# Patient Record
Sex: Female | Born: 1955 | Race: Black or African American | Hispanic: No | Marital: Single | Smoking: Never smoker
Health system: Southern US, Community
[De-identification: ages and names within clinical notes are randomized; demographics above are authoritative.]

## PROBLEM LIST (undated history)

## (undated) DIAGNOSIS — I10 Essential (primary) hypertension: Secondary | ICD-10-CM

## (undated) DIAGNOSIS — F200 Paranoid schizophrenia: Secondary | ICD-10-CM

## (undated) HISTORY — PX: CHOLECYSTECTOMY: SHX55

## (undated) HISTORY — DX: Paranoid schizophrenia: F20.0

## (undated) HISTORY — DX: Essential (primary) hypertension: I10

---

## 2017-01-08 ENCOUNTER — Encounter (HOSPITAL_COMMUNITY): Payer: Self-pay | Admitting: Licensed Clinical Social Worker

## 2017-01-08 ENCOUNTER — Emergency Department (HOSPITAL_COMMUNITY)
Admission: EM | Admit: 2017-01-08 | Discharge: 2017-01-10 | Disposition: A | Discharge status: Other Acute Inpatient Hospital | Payer: Medicare Other | Attending: Emergency Medicine | Admitting: Emergency Medicine

## 2017-01-08 DIAGNOSIS — Z046 Encounter for general psychiatric examination, requested by authority: Secondary | ICD-10-CM | POA: Diagnosis not present

## 2017-01-08 DIAGNOSIS — I10 Essential (primary) hypertension: Secondary | ICD-10-CM | POA: Diagnosis present

## 2017-01-08 DIAGNOSIS — F200 Paranoid schizophrenia: Secondary | ICD-10-CM | POA: Diagnosis present

## 2017-01-08 DIAGNOSIS — Z0000 Encounter for general adult medical examination without abnormal findings: Secondary | ICD-10-CM

## 2017-01-08 DIAGNOSIS — F99 Mental disorder, not otherwise specified: Secondary | ICD-10-CM | POA: Diagnosis present

## 2017-01-08 LAB — COMPREHENSIVE METABOLIC PANEL
ALT: 37 U/L (ref 14–54)
AST: 35 U/L (ref 15–41)
Albumin: 4 g/dL (ref 3.5–5.0)
Alkaline Phosphatase: 56 U/L (ref 38–126)
Anion gap: 12 (ref 5–15)
BUN: 18 mg/dL (ref 6–20)
CHLORIDE: 104 mmol/L (ref 101–111)
CO2: 25 mmol/L (ref 22–32)
CREATININE: 1.1 mg/dL — AB (ref 0.44–1.00)
Calcium: 9.4 mg/dL (ref 8.9–10.3)
GFR, EST NON AFRICAN AMERICAN: 53 mL/min — AB (ref >60)
Glucose, Bld: 114 mg/dL — ABNORMAL HIGH (ref 65–99)
Potassium: 3.9 mmol/L (ref 3.5–5.1)
Sodium: 141 mmol/L (ref 135–145)
Total Bilirubin: 0.4 mg/dL (ref 0.3–1.2)
Total Protein: 7.9 g/dL (ref 6.5–8.1)

## 2017-01-08 LAB — ACETAMINOPHEN LEVEL: Acetaminophen (Tylenol), Serum: <10 ug/mL — ABNORMAL LOW (ref 10–30)

## 2017-01-08 LAB — CBC
HCT: 41.5 % (ref 36.0–46.0)
Hemoglobin: 13.6 g/dL (ref 12.0–15.0)
MCH: 27.8 pg (ref 26.0–34.0)
MCHC: 32.8 g/dL (ref 30.0–36.0)
MCV: 84.9 fL (ref 78.0–100.0)
PLATELETS: 189 10*3/uL (ref 150–400)
RBC: 4.89 MIL/uL (ref 3.87–5.11)
RDW: 13.9 % (ref 11.5–15.5)
WBC: 8.2 10*3/uL (ref 4.0–10.5)

## 2017-01-08 LAB — ETHANOL

## 2017-01-08 LAB — SALICYLATE LEVEL

## 2017-01-08 MED ORDER — ACETAMINOPHEN 325 MG PO TABS: 650.0000 mg | ORAL_TABLET | ORAL | Status: DC | PRN

## 2017-01-08 MED ORDER — HALOPERIDOL LACTATE 5 MG/ML IJ SOLN
10.0000 mg | Freq: Four times a day (QID) | INTRAMUSCULAR | Status: DC | PRN
  Administered 2017-01-08: 10 mg via INTRAMUSCULAR
  Filled 2017-01-08 (×2): qty 2

## 2017-01-08 MED ORDER — METOPROLOL TARTRATE 25 MG PO TABS
25.0000 mg | ORAL_TABLET | Freq: Two times a day (BID) | ORAL | Status: DC
  Administered 2017-01-08 – 2017-01-09 (×2): 25 mg via ORAL
  Filled 2017-01-08 (×2): qty 1

## 2017-01-08 MED ORDER — METOPROLOL TARTRATE 25 MG/10 ML ORAL SUSPENSION
25.0000 mg | Freq: Two times a day (BID) | ORAL | Status: DC
  Filled 2017-01-08 (×3): qty 10

## 2017-01-08 MED ORDER — METOPROLOL TARTRATE 25 MG PO TABS
25.0000 mg | ORAL_TABLET | Freq: Two times a day (BID) | ORAL | Status: DC
  Administered 2017-01-08: 25 mg via ORAL
  Filled 2017-01-08: qty 1

## 2017-01-08 MED ORDER — ZIPRASIDONE MESYLATE 20 MG IM SOLR: 10.0000 mg | Freq: Three times a day (TID) | INTRAMUSCULAR | Status: DC | PRN

## 2017-01-08 NOTE — ED Notes (Signed)
Family member wants to take her mothers personal items with her. Pt agrees to this request one bag given to daughter with purse

## 2017-01-08 NOTE — Progress Notes (Signed)
Pt accepted to [facility] [address] [facility] [place] >10 AM on 01/09/17.  Dr. Merideth AbbeyEnrique Lopez accepting. Call report to [phone]   [facility] notified  Timmothy EulerJean T. Kaylyn LimSutter, MSW, LCSWA Clinical Social Work Disposition [phone]

## 2017-01-08 NOTE — ED Notes (Signed)
She is agitated and very resistive of care. Dr. Jeraldine LootsLockwood has signed IVC papers and security and police are here assisting with her care.

## 2017-01-08 NOTE — BH Assessment (Addendum)
[facility]  Per Elta GuadeloupeLaurie Parks, NP, this pt requires psychiatric hospitalization at this time. Pt presents under IVC initiated by EDP Gerhard Munchobert Lockwood, MD.  The following facilities have been contacted to seek placement for this pt, with results as noted:  Beds available, information sent, decision pending:  [facility]   At capacity:  [facility]   Mattelyn Imhoff, [place] Triage Specialist [phone]

## 2017-01-08 NOTE — ED Notes (Signed)
Bed: [facility] Expected date:  Expected time:  Means of arrival:  Comments: 

## 2017-01-08 NOTE — ED Provider Notes (Signed)
[facility]Y DEPT Provider Note   CSN: [id number] Arrival date & time: 01/08/17  1128     History   Chief Complaint Chief Complaint  Patient presents with  . Mental Health Problem    HPI Janet Francis is a 61 y.o. female.  HPI  Patient presents with daughter who provides most of the history of present illness as the patient is delusional, paranoid, verbally aggressive. Level V caveat secondary to psychiatric state. The patient was brought here by her daughter, under the pre-text of having her grandson evaluated.  Patient denies medical problems, psychiatric problems, but it is clear through discussion with her and her daughter that the patient has previously been hospitalized committed in multiple states, multiple times, takes no medication, though she has been prescribed psychotropic meds. Reportedly the patient is paranoid, delusional,Agitated, will not water due to concerns of it being contaminated with bleach, intentionally, but others, perseverates on paranoid statements, is prone to outbursts, erratic behavior.  Initially the patient's daughter is planning to proceed to executed involuntary commitment papers on the patient, but given the patient's agitated status here, I did so.   No past medical history on file.  There are no active problems to display for this patient.   No past surgical history on file.  OB History    No data available       Home Medications    Prior to Admission medications   Not on File    Family History No family history on file.  Social History Social History  Substance Use Topics  . Smoking status: Not on file  . Smokeless tobacco: Not on file  . Alcohol use Not on file     Allergies   Patient has no allergy information on record.   Review of Systems Review of Systems  Unable to perform ROS: Psychiatric disorder     Physical Exam Updated Vital Signs There were no vitals taken for this visit.  Physical Exam    Constitutional: She is oriented to person, place, and time. She appears well-developed and well-nourished. She appears distressed.  HENT:  Head: Normocephalic and atraumatic.  Eyes: Conjunctivae and EOM are normal.  Cardiovascular: Normal rate and regular rhythm.   Pulmonary/Chest: Effort normal and breath sounds normal. No stridor. No respiratory distress.  Abdominal: She exhibits no distension.  Musculoskeletal: She exhibits no edema.  Neurological: She is alert and oriented to person, place, and time. No cranial nerve deficit.  Skin: Skin is warm and dry.  Psychiatric: Her mood appears anxious. Her affect is angry and blunt. Her speech is rapid and/or pressured. She is aggressive and combative. Thought content is paranoid and delusional. Cognition and memory are impaired.  Nursing note and vitals reviewed.    ED Treatments / Results  Labs (all labs ordered are listed, but only abnormal results are displayed) Labs Reviewed - No data to display    Procedures Procedures (including critical care time)  Medications Ordered in ED Medications  haloperidol lactate (HALDOL) injection 10 mg (10 mg Intramuscular Given 01/08/17 1241)  acetaminophen (TYLENOL) tablet 650 mg (not administered)  ziprasidone (GEODON) injection 10 mg (not administered)   After the initial evaluation with concerns for the patient's mental health status, ongoing paranoid fixation, delusional statements, and no insight into her condition, couple of patient's aggressive, agitated behavior I executed involuntary commitment papers.   Patient is found to have some hypertension, received liquid beta blocker.  Initial Impression / Assessment and Plan / ED Course  I have  reviewed the triage vital signs and the nursing notes.  Pertinent labs & imaging results that were available during my care of the patient were reviewed by me and considered in my medical decision making (see chart for details).  Elderly female  presents with family members due to concern of persistent/worsening paranoid delusional thought processes, agitated behavior. Patient has previously diagnosed gastric disease, but does not take any medication currently. Patient herself denies any problems, but is clearly delusional. Patient required sedation to facilitate care, and after involuntary commitment papers were completed, she was medically clear for psychiatric evaluation.  Final Clinical Impressions(s) / ED Diagnoses  Psychosis   Gerhard MunchLockwood, Sugar Vanzandt, MD 01/08/17 [phone]
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
## 2017-01-08 NOTE — ED Triage Notes (Signed)
Her daughter states pt. Is paranoid, I.e. "she won't drink water because it has bleach in it". Also, she will only eat "fast food". Her daughter further tells me pt. Has been under [place]C [place] both [place] and also [facility]. She arrives with her daughter and two grandchildren. Pt. Came in to our department by dint of a feint telling her the visit was for one of the grandchildren.

## 2017-01-08 NOTE — ED Notes (Signed)
Bed: [id number] Expected date:  Expected time:  Means of arrival:  Comments: Patient coming back to room after endoscopy

## 2017-01-08 NOTE — ED Notes (Signed)
Patient transferred to TCU. In red scrubs, sitter accompanied patient. Provided patient with meal tray. Informed MD of elevated BP. Order for liquid metoprolol placed by MD. Patient currently refusing to eat or drink. Also refusing to answer questions. Asks that she be released in 24 hours because 'that's all they can hold me for'.

## 2017-01-08 NOTE — BH Assessment (Signed)
Patient's oldest daughter is Elwyn LadeShanetta Moore [phone]. [place]. She is concerned about her mother. Sts that she feels her mother is not able to care for herself. She welcomes [facility] staff to call for collateral information if needed.

## 2017-01-08 NOTE — ED Notes (Signed)
Re checked bp spoke with provider

## 2017-01-09 ENCOUNTER — Emergency Department (HOSPITAL_COMMUNITY): Payer: Medicare Other

## 2017-01-09 DIAGNOSIS — I10 Essential (primary) hypertension: Secondary | ICD-10-CM | POA: Diagnosis present

## 2017-01-09 DIAGNOSIS — Z0000 Encounter for general adult medical examination without abnormal findings: Secondary | ICD-10-CM

## 2017-01-09 DIAGNOSIS — F200 Paranoid schizophrenia: Secondary | ICD-10-CM | POA: Diagnosis not present

## 2017-01-09 LAB — URINALYSIS, ROUTINE W REFLEX MICROSCOPIC
Bilirubin Urine: NEGATIVE
Glucose, UA: NEGATIVE mg/dL
Hgb urine dipstick: NEGATIVE
KETONES UR: NEGATIVE mg/dL
Nitrite: NEGATIVE
Protein, ur: NEGATIVE mg/dL
SPECIFIC GRAVITY, URINE: 1.006 (ref 1.005–1.030)
pH: 6 (ref 5.0–8.0)

## 2017-01-09 LAB — RAPID URINE DRUG SCREEN, HOSP PERFORMED
AMPHETAMINES: NOT DETECTED
Barbiturates: NOT DETECTED
Benzodiazepines: NOT DETECTED
COCAINE: NOT DETECTED
OPIATES: NOT DETECTED
Tetrahydrocannabinol: NOT DETECTED

## 2017-01-09 MED ORDER — METOPROLOL TARTRATE 25 MG PO TABS
50.0000 mg | ORAL_TABLET | Freq: Two times a day (BID) | ORAL | Status: DC
  Administered 2017-01-09: 50 mg via ORAL
  Filled 2017-01-09: qty 2

## 2017-01-09 MED ORDER — AMLODIPINE BESYLATE 5 MG PO TABS
10.0000 mg | ORAL_TABLET | Freq: Every day | ORAL | Status: DC
  Administered 2017-01-09 – 2017-01-10 (×2): 10 mg via ORAL
  Filled 2017-01-09 (×2): qty 2

## 2017-01-09 MED ORDER — CARBAMAZEPINE ER 200 MG PO TB12
200.0000 mg | ORAL_TABLET | Freq: Two times a day (BID) | ORAL | Status: DC
  Administered 2017-01-09 – 2017-01-10 (×2): 200 mg via ORAL
  Filled 2017-01-09 (×3): qty 1

## 2017-01-09 MED ORDER — LISINOPRIL 10 MG PO TABS
10.0000 mg | ORAL_TABLET | Freq: Every day | ORAL | Status: DC
  Administered 2017-01-09 – 2017-01-10 (×2): 10 mg via ORAL
  Filled 2017-01-09 (×2): qty 1

## 2017-01-09 MED ORDER — RISPERIDONE 1 MG PO TBDP
1.0000 mg | ORAL_TABLET | Freq: Two times a day (BID) | ORAL | Status: DC
  Administered 2017-01-09 – 2017-01-10 (×3): 1 mg via ORAL
  Filled 2017-01-09 (×3): qty 1

## 2017-01-09 NOTE — ED Notes (Signed)
Patient is not cooperating with obtaining a urine sample.  She keeps taking the urine "hat" off the toilet before urinating.  Earlier this morning she gave the tech a specimen that was water.
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
## 2017-01-09 NOTE — ED Notes (Signed)
[facility], but they said her blood pressure would have to be under control before she could be admitted there.  Dr. Clarene DukeMcManus advised to let the psyche team know when rounding this morning.

## 2017-01-09 NOTE — ED Notes (Signed)
bp reported to provider  bp med given and reported to provider  Provider to investigate future meds and resolve

## 2017-01-09 NOTE — ED Notes (Signed)
Waiting urine  

## 2017-01-09 NOTE — ED Notes (Signed)
Pt up to door.  Pt denied any needs.

## 2017-01-09 NOTE — ED Notes (Signed)
Patient signed a release of information form to share info with Regan RakersLakiesha Foreman and Laureen AbrahamsShanita Moore.

## 2017-01-09 NOTE — ED Notes (Signed)
Patient agreed to take Risperidone after much urging by nurse, but refused to take Tegretol.

## 2017-01-09 NOTE — ED Notes (Signed)
Patient transported to X-ray 

## 2017-01-09 NOTE — ED Provider Notes (Signed)
Psych team continues concerned regarding continued elevated BP despite pt taking one dose of lisinopril and Risperdal a short time ago. I do not believe pt needs medical admission for her BP, but rather optimization of, and to take, PO meds. T/C to [facility] Dr. Arthor CaptainElmahi, case discussed, including:  HPI, pertinent PM/SHx, VS/PE, dx testing, ED course and treatment:  Agreeable to consult regarding BP management.    Samuel JesterMcManus, Oneida Mckamey, DO 01/09/17 1315

## 2017-01-09 NOTE — ED Notes (Signed)
[facility]  They will hold patient's bed as long as she is compliant with BP medications until it begins to come down.

## 2017-01-09 NOTE — ED Notes (Signed)
Nurse thought patient had taken her medication, but after putting it in her mouth she quickly removed it and put it in her pocket.  When asked why she wouldn't take her medication, patient said she takes certain medications in some states but not in others.  Dr. Clarene DukeMcManus notified.

## 2017-01-09 NOTE — ED Notes (Signed)
Explained risks of not taking blood pressure medication to patient.  Patient stated that Janet PieriniMichael Jackson died taking medication so that was why she did not want to take any.  When nurse explained that it was a completely different medication than her blood pressure medications, she replied she just doesn't want to take them.
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
## 2017-01-09 NOTE — ED Provider Notes (Signed)
T/C from Psych Dr. Jannifer FranklinAkintayo:  Requested EDP to evaluate pt for 2nd opinion regarding pt's paranoia, refusal to take meds, and need to force meds. Upon my evaluation: pt peering out of exam room, looking up and down hallways, then returning to her bed to sit and make notes on a piece of paper. Pt then folded the paper and moved it to the other side of her so I would not see it. For her safety, pt will need forced meds if she further continues to refuse.    Samuel JesterMcManus, Drucella Karbowski, DO 01/09/17 1228

## 2017-01-09 NOTE — Consult Note (Signed)
Medical Consultation   Janet Francis  ZOX:[id number]  DOB: 10-15-1955  DOA: 01/08/2017  PCP: Patient, No Pcp Per   Outpatient Specialists: N/A   Requesting physician: Clarene DukeMcManus  Reason for consultation: Accelerated hypertension   History of Present Illness: Janet Francis is an 61 y.o. female with basilar history of blood pressure and paranoid schizophrenia brought to the hospital by her daughter because of worsening paranoia. Patient being in the ED holding area to wait for psychiatric admission, blood pressure in the high side, his BP around 200, she refused medications yesterday so [facility] consulted for further evaluation. I spoke with the patient for very long time she is extremely paranoid, we discussed about starting amlodipine, and she said she will not take it before she understand what it is, I have to give her my phone, she googled amlodipine before she agreed to take it. She denies any chest pain, denies any shortness of breath or palpitations.  Review of Systems:  ROS As per HPI otherwise 10 point review of systems negative.    Past Medical History: No past medical history on file.  Past Surgical History: No past surgical history on file.   Allergies:  Not on File   Social History:  has no tobacco, alcohol, and drug history on file.   Family History: No family history of premature heart disease  Physical Exam: Vitals:   01/09/17 0627 01/09/17 0629 01/09/17 0752 01/09/17 1246  BP: (!) 202/104 (!) 204/85 (!) 219/83 (!) 213/98  Pulse: (!) 101  (!) 101 99  Resp: 18     Temp: 98.5 F (36.9 C)   98.4 F (36.9 C)  TempSrc: Oral   Oral  SpO2: 100%  100% 98%    Constitutional:   Alert and awake, oriented x3, not in any acute distress. Eyes: PERLA, EOMI, irises appear normal, anicteric sclera,  ENMT: external ears and nose appear normal            Lips appears normal, oropharynx mucosa, tongue, posterior pharynx appear normal  Neck: neck  appears normal, no masses, normal ROM, no thyromegaly, no JVD  CVS: S1-S2 clear, no murmur rubs or gallops, no LE edema, normal pedal pulses  Respiratory:  clear to auscultation bilaterally, no wheezing, rales or rhonchi. Respiratory effort normal. No accessory muscle use.  Abdomen: soft nontender, nondistended, normal bowel sounds, no hepatosplenomegaly, no hernias  Musculoskeletal: : no cyanosis, clubbing or edema noted bilaterally Neuro: Cranial nerves II-XII intact, strength, sensation, reflexes Psych: judgement and insight appear normal, stable mood and affect, mental status Skin: no rashes or lesions or ulcers, no induration or nodules   Data reviewed:  I have personally reviewed following labs and imaging studies Labs:  CBC:  Recent Labs Lab 01/08/17 1418  WBC 8.2  HGB 13.6  HCT 41.5  MCV 84.9  PLT 189    Basic Metabolic Panel:  Recent Labs Lab 01/08/17 1418  NA 141  K 3.9  CL 104  CO2 25  GLUCOSE 114*  BUN 18  CREATININE 1.10*  CALCIUM 9.4   GFR CrCl cannot be calculated (Unknown ideal weight.). Liver Function Tests:  Recent Labs Lab 01/08/17 1418  AST 35  ALT 37  ALKPHOS 56  BILITOT 0.4  PROT 7.9  ALBUMIN 4.0   No results for input(s): LIPASE, AMYLASE in the last 168 hours. No results for input(s): AMMONIA in the last 168 hours. Coagulation profile No results for  input(s): INR, PROTIME in the last 168 hours.  Cardiac Enzymes: No results for input(s): CKTOTAL, CKMB, CKMBINDEX, TROPONINI in the last 168 hours. BNP: Invalid input(s): POCBNP CBG: No results for input(s): GLUCAP in the last 168 hours. D-Dimer No results for input(s): DDIMER in the last 72 hours. Hgb A1c No results for input(s): HGBA1C in the last 72 hours. Lipid Profile No results for input(s): CHOL, HDL, LDLCALC, TRIG, CHOLHDL, LDLDIRECT in the last 72 hours. Thyroid function studies No results for input(s): TSH, T4TOTAL, T3FREE, THYROIDAB in the last 72 hours.  Invalid  input(s): FREET3 Anemia work up No results for input(s): VITAMINB12, FOLATE, FERRITIN, TIBC, IRON, RETICCTPCT in the last 72 hours. Urinalysis No results found for: COLORURINE, APPEARANCEUR, LABSPEC, PHURINE, GLUCOSEU, HGBUR, BILIRUBINUR, KETONESUR, PROTEINUR, UROBILINOGEN, NITRITE, LEUKOCYTESUR   Microbiology No results found for this or any previous visit (from the past 240 hour(s)).     Inpatient Medications:   Scheduled Meds: . amLODipine  10 mg Oral Daily  . carbamazepine  200 mg Oral BID  . lisinopril  10 mg Oral Daily  . risperiDONE  1 mg Oral BID   Continuous Infusions:   Radiological Exams on Admission: Dg Chest 2 View  Result Date: 01/09/2017 CLINICAL DATA:  Medical clearance EXAM: CHEST  2 VIEW COMPARISON:  None FINDINGS: Normal heart size, mediastinal contours, and pulmonary vascularity. Lungs clear. No pleural effusion or pneumothorax. Bones unremarkable. IMPRESSION: Normal exam. Electronically Signed   By: Ulyses Southward M.D.   On: 01/09/2017 12:22    Impression/Recommendations Principal Problem:   Paranoid schizophrenia (HCC)  Accelerated hypertension -Blood pressure is 130/100, patient restarted on lisinopril by EDP. Also started on metoprolol. -Patient has paranoia, did not take metoprolol and only took lisinopril. She does not have IV access for IV meds. -She is agreeable to take amlodipine, going to be on 10 mg daily. -She reported that she is off of her medication for the past 6 months, tried to bring down the BP slowly  Paranoid schizophrenia -Per primary service and psychiatry   Thank you for this consultation.  Our [facility] hospitalist team will follow the patient with you.   Time Spent: 45 minutes  Raybon Conard A M.D. [facility] 01/09/2017, 3:06 PM

## 2017-01-09 NOTE — ED Notes (Signed)
Patient took lisinopril reluctantly.  She was worried because it was not the same color as the one she is used to.

## 2017-01-10 NOTE — ED Notes (Signed)
[facility]: Estill Cottahomas Cornwall, MD.

## 2017-04-01 ENCOUNTER — Encounter: Payer: Self-pay | Admitting: Internal Medicine

## 2017-04-01 ENCOUNTER — Ambulatory Visit (INDEPENDENT_AMBULATORY_CARE_PROVIDER_SITE_OTHER): Payer: Medicare Other | Admitting: Internal Medicine

## 2017-04-01 VITALS — BP 156/90 | HR 82 | Temp 98.1°F | Resp 12 | Wt 200.0 lb

## 2017-04-01 DIAGNOSIS — F259 Schizoaffective disorder, unspecified: Secondary | ICD-10-CM | POA: Diagnosis not present

## 2017-04-01 DIAGNOSIS — R140 Abdominal distension (gaseous): Secondary | ICD-10-CM | POA: Diagnosis not present

## 2017-04-01 DIAGNOSIS — Z79899 Other long term (current) drug therapy: Secondary | ICD-10-CM | POA: Diagnosis not present

## 2017-04-01 DIAGNOSIS — E010 Iodine-deficiency related diffuse (endemic) goiter: Secondary | ICD-10-CM

## 2017-04-01 DIAGNOSIS — I10 Essential (primary) hypertension: Secondary | ICD-10-CM | POA: Diagnosis not present

## 2017-04-01 MED ORDER — LISINOPRIL 10 MG PO TABS: 10.0000 mg | ORAL_TABLET | Freq: Every day | ORAL | 2 refills | Status: DC

## 2017-04-01 MED ORDER — AMLODIPINE BESYLATE 10 MG PO TABS: 10.0000 mg | ORAL_TABLET | Freq: Every day | ORAL | 2 refills | Status: DC

## 2017-04-01 MED ORDER — RISPERIDONE 3 MG PO TABS: 3.0000 mg | ORAL_TABLET | Freq: Every day | ORAL | 2 refills | Status: DC

## 2017-04-01 MED ORDER — RISPERIDONE 1 MG PO TABS: 1.0000 mg | ORAL_TABLET | Freq: Every morning | ORAL | 2 refills | Status: DC

## 2017-04-01 NOTE — Patient Instructions (Addendum)
Continue current medications as ordered  Reduce fatty foods in diet  Recommend follow up with mental health for psych management  Will call with lab results  Follow up in 3 mos for HTN, schizoaffective disorder
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
## 2017-04-02 DIAGNOSIS — R140 Abdominal distension (gaseous): Secondary | ICD-10-CM | POA: Insufficient documentation

## 2017-04-02 LAB — CBC WITH DIFFERENTIAL/PLATELET
Basophils Absolute: 17 {cells}/uL (ref 0–200)
Basophils Relative: 0.3 %
Eosinophils Absolute: 52 {cells}/uL (ref 15–500)
Eosinophils Relative: 0.9 %
HCT: 41.7 % (ref 35.0–45.0)
Hemoglobin: 13.8 g/dL (ref 11.7–15.5)
Lymphs Abs: 2024 {cells}/uL (ref 850–3900)
MCH: 27.7 pg (ref 27.0–33.0)
MCHC: 33.1 g/dL (ref 32.0–36.0)
MCV: 83.6 fL (ref 80.0–100.0)
MPV: 11.4 fL (ref 7.5–12.5)
Monocytes Relative: 7.4 %
Neutro Abs: 3277 {cells}/uL (ref 1500–7800)
Neutrophils Relative %: 56.5 %
Platelets: 176 Thousand/uL (ref 140–400)
RBC: 4.99 Million/uL (ref 3.80–5.10)
RDW: 13.5 % (ref 11.0–15.0)
Total Lymphocyte: 34.9 %
WBC mixed population: 429 {cells}/uL (ref 200–950)
WBC: 5.8 Thousand/uL (ref 3.8–10.8)

## 2017-04-02 LAB — COMPLETE METABOLIC PANEL WITH GFR
AG Ratio: 1.1 (calc) (ref 1.0–2.5)
ALBUMIN MSPROF: 4.1 g/dL (ref 3.6–5.1)
ALKALINE PHOSPHATASE (APISO): 66 U/L (ref 33–130)
ALT: 19 U/L (ref 6–29)
AST: 22 U/L (ref 10–35)
BUN / CREAT RATIO: 12 (calc) (ref 6–22)
BUN: 14 mg/dL (ref 7–25)
CO2: 30 mmol/L (ref 20–32)
CREATININE: 1.18 mg/dL — AB (ref 0.50–0.99)
Calcium: 9.4 mg/dL (ref 8.6–10.4)
Chloride: 101 mmol/L (ref 98–110)
GFR, EST AFRICAN AMERICAN: 57 mL/min/{1.73_m2} — AB (ref >=60)
GFR, Est Non African American: 49 mL/min/{1.73_m2} — ABNORMAL LOW (ref >=60)
GLUCOSE: 96 mg/dL (ref 65–99)
Globulin: 3.7 g/dL (calc) (ref 1.9–3.7)
Potassium: 4.4 mmol/L (ref 3.5–5.3)
Sodium: 139 mmol/L (ref 135–146)
Total Bilirubin: 0.5 mg/dL (ref 0.2–1.2)
Total Protein: 7.8 g/dL (ref 6.1–8.1)

## 2017-04-02 LAB — TSH: TSH: 0.81 m[IU]/L (ref 0.40–4.50)

## 2017-04-02 LAB — LIPID PANEL
Cholesterol: 232 mg/dL — ABNORMAL HIGH
HDL: 54 mg/dL
LDL Cholesterol (Calc): 135 mg/dL — ABNORMAL HIGH
Non-HDL Cholesterol (Calc): 178 mg/dL — ABNORMAL HIGH
Total CHOL/HDL Ratio: 4.3 (calc)
Triglycerides: 304 mg/dL — ABNORMAL HIGH

## 2017-04-02 LAB — T4, FREE: Free T4: 0.7 ng/dL — ABNORMAL LOW (ref 0.8–1.8)

## 2017-07-01 ENCOUNTER — Ambulatory Visit (INDEPENDENT_AMBULATORY_CARE_PROVIDER_SITE_OTHER): Payer: Medicare Other | Admitting: Internal Medicine

## 2017-07-01 ENCOUNTER — Encounter: Payer: Self-pay | Admitting: Internal Medicine

## 2017-07-01 ENCOUNTER — Ambulatory Visit: Payer: Medicare Other | Admitting: Internal Medicine

## 2017-07-01 VITALS — BP 148/84 | HR 73 | Temp 98.7°F | Ht 66.0 in | Wt 199.4 lb

## 2017-07-01 DIAGNOSIS — I10 Essential (primary) hypertension: Secondary | ICD-10-CM

## 2017-07-01 DIAGNOSIS — E010 Iodine-deficiency related diffuse (endemic) goiter: Secondary | ICD-10-CM

## 2017-07-01 DIAGNOSIS — F259 Schizoaffective disorder, unspecified: Secondary | ICD-10-CM | POA: Diagnosis not present

## 2017-07-01 MED ORDER — RISPERIDONE 2 MG PO TABS
2.0000 mg | ORAL_TABLET | Freq: Every morning | ORAL | 6 refills | Status: DC
Start: 2017-07-01 — End: 2018-02-04
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
## 2017-07-01 NOTE — Patient Instructions (Signed)
INCREASE MORNING RISPERDAL TO 2MG  DAILY. Continue bedtime dose  Recommend you follow up with psych provider  Continue other medications as ordered  Follow up in 3 mos for schizoaffective d/o, HTN.

## 2017-09-22 DIAGNOSIS — H35033 Hypertensive retinopathy, bilateral: Secondary | ICD-10-CM | POA: Diagnosis not present

## 2017-09-22 DIAGNOSIS — I10 Essential (primary) hypertension: Secondary | ICD-10-CM | POA: Diagnosis not present

## 2017-09-22 DIAGNOSIS — H35373 Puckering of macula, bilateral: Secondary | ICD-10-CM | POA: Diagnosis not present

## 2017-09-22 DIAGNOSIS — H2513 Age-related nuclear cataract, bilateral: Secondary | ICD-10-CM | POA: Diagnosis not present

## 2017-09-30 ENCOUNTER — Ambulatory Visit (INDEPENDENT_AMBULATORY_CARE_PROVIDER_SITE_OTHER): Payer: Medicare Other | Admitting: Internal Medicine

## 2017-09-30 ENCOUNTER — Encounter: Payer: Self-pay | Admitting: Internal Medicine

## 2017-09-30 ENCOUNTER — Ambulatory Visit: Payer: Medicare Other | Admitting: Internal Medicine

## 2017-09-30 VITALS — BP 168/92 | HR 74 | Temp 98.0°F | Ht 66.0 in | Wt 202.2 lb

## 2017-09-30 DIAGNOSIS — I10 Essential (primary) hypertension: Secondary | ICD-10-CM

## 2017-09-30 DIAGNOSIS — E782 Mixed hyperlipidemia: Secondary | ICD-10-CM | POA: Diagnosis not present

## 2017-09-30 DIAGNOSIS — Z79899 Other long term (current) drug therapy: Secondary | ICD-10-CM

## 2017-09-30 DIAGNOSIS — F259 Schizoaffective disorder, unspecified: Secondary | ICD-10-CM

## 2017-09-30 LAB — LIPID PANEL
CHOL/HDL RATIO: 4.2 (calc) (ref <5.0)
CHOLESTEROL: 204 mg/dL — AB (ref <200)
HDL: 49 mg/dL — ABNORMAL LOW (ref >50)
LDL Cholesterol (Calc): 130 mg/dL (calc) — ABNORMAL HIGH
NON-HDL CHOLESTEROL (CALC): 155 mg/dL — AB (ref <130)
Triglycerides: 140 mg/dL (ref <150)

## 2017-09-30 LAB — BASIC METABOLIC PANEL WITH GFR
BUN/Creatinine Ratio: 19 (calc) (ref 6–22)
BUN: 20 mg/dL (ref 7–25)
CALCIUM: 9.4 mg/dL (ref 8.6–10.4)
CHLORIDE: 107 mmol/L (ref 98–110)
CO2: 29 mmol/L (ref 20–32)
Creat: 1.08 mg/dL — ABNORMAL HIGH (ref 0.50–0.99)
GFR, Est African American: 64 mL/min/{1.73_m2} (ref >=60)
GFR, Est Non African American: 55 mL/min/{1.73_m2} — ABNORMAL LOW (ref >=60)
GLUCOSE: 103 mg/dL — AB (ref 65–99)
POTASSIUM: 4.8 mmol/L (ref 3.5–5.3)
Sodium: 142 mmol/L (ref 135–146)

## 2017-09-30 LAB — ALT: ALT: 17 U/L (ref 6–29)
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
## 2017-09-30 NOTE — Patient Instructions (Signed)
Continue current medications as ordered  Increase exercise as tolerated  Follow up in 3 mos for HTN, hyperlipidemia, schizoaffective d/o.

## 2017-10-02 LAB — BASIC METABOLIC PANEL WITH GFR

## 2017-10-02 LAB — LIPID PANEL

## 2017-10-20 ENCOUNTER — Telehealth: Payer: Self-pay | Admitting: Internal Medicine

## 2017-10-20 NOTE — Telephone Encounter (Signed)
Left msg asking pt to confirm AWV-I appt w/ nurse before seeing Dr. Montez Moritaarter on 12/31/17. VDM (DD)

## 2017-10-21 ENCOUNTER — Telehealth: Payer: Self-pay | Admitting: *Deleted

## 2017-10-21 NOTE — Telephone Encounter (Signed)
Regan RakersLakiesha Foreman, Guardian dropped off Disability Form for Dr. Montez Moritaarter to fill out. Please call Landry CorporalLakiesha at [phone] cell once completed.   Placed in Dr. Celene Skeenarter's folder to review, fill out and sign.

## 2017-10-22 ENCOUNTER — Telehealth: Payer: Self-pay

## 2017-10-22 NOTE — Telephone Encounter (Signed)
Landry CorporalLakiesha came to the office to pick up the forms and records that she dropped off with forms. She asked who could possibly help her to get the forms completed before patient's SSI was stopped and it was suggested that she contact her social security case worker or the provider that was following patient for psych. She stated that patient would not go to psych any longer and she was unsure of what to do. She would like any suggestions that can be provided by Dr. Montez Moritaarter.

## 2017-10-22 NOTE — Telephone Encounter (Signed)
I spoke with patient's caregiver, Landry CorporalLakiesha, to let her know that Dr. Montez Moritaarter could not complete the disability forms that were left. Per Dr. Montez Moritaarter, she cannot complete disability forms because she is not qualified to make that determination. Per Dr. Montez Moritaarter, forms should be completed by disability specialist.   Landry CorporalLakiesha stated that she would like a call directly from Dr. Montez Moritaarter to explain why she won't complete forms.   Forms and 2 large packages of records were left at the front desk for [employer] Inc[facility]iesha to pick up.

## 2017-10-22 NOTE — Telephone Encounter (Signed)
She needs to see a disability specialist OR (and preferably) psych to complete forms

## 2017-12-10 ENCOUNTER — Other Ambulatory Visit: Payer: Self-pay

## 2017-12-10 NOTE — Patient Outreach (Signed)
[facility]) Care Management  12/10/2017  Janet Francis [birth year] [id number]   Medication Adherence call to Mrs. Janet Francis patient did not answer [facility] said patient has not pick up since April/2019  patient is due on Lisinopril 10 mg Mrs. Janet Francis is showing up under [facility] Ins.for Medication Adherence.    Janet Francis CPhT Pharmacy Technician [facility] Care Management Direct Dial [phone]  Fax [phone] [email or web address]

## 2017-12-26 ENCOUNTER — Other Ambulatory Visit: Payer: Self-pay

## 2017-12-26 NOTE — Patient Outreach (Signed)
[facility] Care Management  12/26/2017  Isabell JarvisShirley A Foreman-Mayber [birth year] [id number]   Medication Adherence call to Mrs. Kearney HardShirley Foreman spoke with patient she said she is no longer taking Lisinopril 10 mg doctor took her off this medication.Mrs. Talbert ForestShirley is showing past due under [facility].   Lillia AbedAna Ollison-Moran CPhT Pharmacy Technician [facility] Care Management Direct Dial [phone]  Fax [phone] [email or web address]

## 2017-12-31 ENCOUNTER — Encounter: Payer: Self-pay | Admitting: Internal Medicine

## 2017-12-31 ENCOUNTER — Ambulatory Visit (INDEPENDENT_AMBULATORY_CARE_PROVIDER_SITE_OTHER): Payer: Medicare Other

## 2017-12-31 ENCOUNTER — Ambulatory Visit (INDEPENDENT_AMBULATORY_CARE_PROVIDER_SITE_OTHER): Payer: Medicare Other | Admitting: Internal Medicine

## 2017-12-31 VITALS — BP 172/110 | HR 84 | Temp 98.0°F | Ht 66.0 in | Wt 207.0 lb

## 2017-12-31 DIAGNOSIS — E782 Mixed hyperlipidemia: Secondary | ICD-10-CM | POA: Diagnosis not present

## 2017-12-31 DIAGNOSIS — I10 Essential (primary) hypertension: Secondary | ICD-10-CM

## 2017-12-31 DIAGNOSIS — Z0000 Encounter for general adult medical examination without abnormal findings: Secondary | ICD-10-CM

## 2017-12-31 DIAGNOSIS — E66811 Obesity, class 1: Secondary | ICD-10-CM

## 2017-12-31 DIAGNOSIS — Z79899 Other long term (current) drug therapy: Secondary | ICD-10-CM | POA: Diagnosis not present

## 2017-12-31 DIAGNOSIS — F259 Schizoaffective disorder, unspecified: Secondary | ICD-10-CM | POA: Diagnosis not present

## 2017-12-31 DIAGNOSIS — E669 Obesity, unspecified: Secondary | ICD-10-CM

## 2017-12-31 NOTE — Patient Instructions (Signed)
Janet Francis , Thank you for taking time to come for your Medicare Wellness Visit. I appreciate your ongoing commitment to your health goals. Please review the following plan we discussed and let me know if I can assist you in the future.   Screening recommendations/referrals: Colonoscopy/Cologuard due, cologuard information signed Mammogram due, declined Bone Density due at age 62 Recommended yearly ophthalmology/optometry visit for glaucoma screening and checkup Recommended yearly dental visit for hygiene and checkup  Vaccinations: Influenza vaccine due, declined Pneumococcal vaccine due at age 62 Tdap vaccine due, ordered to pharmacy Shingles vaccine due, declined    Advanced directives: In chart  Conditions/risks identified: none  Next appointment: Tyson Dense, RN 01/06/2019 @ 8:30am  Preventive Care 40-64 Years, Female Preventive care refers to lifestyle choices and visits with your health care provider that can promote health and wellness. What does preventive care include?  A yearly physical exam. This is also called an annual well check.  Dental exams once or twice a year.  Routine eye exams. Ask your health care provider how often you should have your eyes checked.  Personal lifestyle choices, including:  Daily care of your teeth and gums.  Regular physical activity.  Eating a healthy diet.  Avoiding tobacco and drug use.  Limiting alcohol use.  Practicing safe sex.  Taking low-dose aspirin daily starting at age 33.  Taking vitamin and mineral supplements as recommended by your health care provider. What happens during an annual well check? The services and screenings done by your health care provider during your annual well check will depend on your age, overall health, lifestyle risk factors, and family history of disease. Counseling  Your health care provider may ask you questions about your:  Alcohol use.  Tobacco use.  Drug use.  Emotional  well-being.  Home and relationship well-being.  Sexual activity.  Eating habits.  Work and work Statistician.  Method of birth control.  Menstrual cycle.  Pregnancy history. Screening  You may have the following tests or measurements:  Height, weight, and BMI.  Blood pressure.  Lipid and cholesterol levels. These may be checked every 5 years, or more frequently if you are over 94 years old.  Skin check.  Lung cancer screening. You may have this screening every year starting at age 54 if you have a 30-pack-year history of smoking and currently smoke or have quit within the past 15 years.  Fecal occult blood test (FOBT) of the stool. You may have this test every year starting at age 42.  Flexible sigmoidoscopy or colonoscopy. You may have a sigmoidoscopy every 5 years or a colonoscopy every 10 years starting at age 77.  Hepatitis C blood test.  Hepatitis B blood test.  Sexually transmitted disease (STD) testing.  Diabetes screening. This is done by checking your blood sugar (glucose) after you have not eaten for a while (fasting). You may have this done every 1-3 years.  Mammogram. This may be done every 1-2 years. Talk to your health care provider about when you should start having regular mammograms. This may depend on whether you have a family history of breast cancer.  BRCA-related cancer screening. This may be done if you have a family history of breast, ovarian, tubal, or peritoneal cancers.  Pelvic exam and Pap test. This may be done every 3 years starting at age 68. Starting at age 82, this may be done every 5 years if you have a Pap test in combination with an HPV test.  Bone density  scan. This is done to screen for osteoporosis. You may have this scan if you are at high risk for osteoporosis. Discuss your test results, treatment options, and if necessary, the need for more tests with your health care provider. Vaccines  Your health care provider may recommend  certain vaccines, such as:  Influenza vaccine. This is recommended every year.  Tetanus, diphtheria, and acellular pertussis (Tdap, Td) vaccine. You may need a Td booster every 10 years.  Zoster vaccine. You may need this after age 67.  Pneumococcal 13-valent conjugate (PCV13) vaccine. You may need this if you have certain conditions and were not previously vaccinated.  Pneumococcal polysaccharide (PPSV23) vaccine. You may need one or two doses if you smoke cigarettes or if you have certain conditions. Talk to your health care provider about which screenings and vaccines you need and how often you need them. This information is not intended to replace advice given to you by your health care provider. Make sure you discuss any questions you have with your health care provider. Document Released: 08/04/2015 Document Revised: 03/27/2016 Document Reviewed: 05/09/2015 Elsevier Interactive Patient Education  2017 [place] Prevention in the Home Falls can cause injuries. They can happen to people of all ages. There are many things you can do to make your home safe and to help prevent falls. What can I do on the outside of my home?  Regularly fix the edges of walkways and driveways and fix any cracks.  Remove anything that might make you trip as you walk through a door, such as a raised step or threshold.  Trim any bushes or trees on the path to your home.  Use bright outdoor lighting.  Clear any walking paths of anything that might make someone trip, such as rocks or tools.  Regularly check to see if handrails are loose or broken. Make sure that both sides of any steps have handrails.  Any raised decks and porches should have guardrails on the edges.  Have any leaves, snow, or ice cleared regularly.  Use sand or salt on walking paths during winter.  Clean up any spills in your garage right away. This includes oil or grease spills. What can I do in the bathroom?  Use  night lights.  Install grab bars by the toilet and in the tub and shower. Do not use towel bars as grab bars.  Use non-skid mats or decals in the tub or shower.  If you need to sit down in the shower, use a plastic, non-slip stool.  Keep the floor dry. Clean up any water that spills on the floor as soon as it happens.  Remove soap buildup in the tub or shower regularly.  Attach bath mats securely with double-sided non-slip rug tape.  Do not have throw rugs and other things on the floor that can make you trip. What can I do in the bedroom?  Use night lights.  Make sure that you have a light by your bed that is easy to reach.  Do not use any sheets or blankets that are too big for your bed. They should not hang down onto the floor.  Have a firm chair that has side arms. You can use this for support while you get dressed.  Do not have throw rugs and other things on the floor that can make you trip. What can I do in the kitchen?  Clean up any spills right away.  Avoid walking on wet floors.  Keep items that you use a lot in easy-to-reach places.  If you need to reach something above you, use a strong step stool that has a grab bar.  Keep electrical cords out of the way.  Do not use floor polish or wax that makes floors slippery. If you must use wax, use non-skid floor wax.  Do not have throw rugs and other things on the floor that can make you trip. What can I do with my stairs?  Do not leave any items on the stairs.  Make sure that there are handrails on both sides of the stairs and use them. Fix handrails that are broken or loose. Make sure that handrails are as long as the stairways.  Check any carpeting to make sure that it is firmly attached to the stairs. Fix any carpet that is loose or worn.  Avoid having throw rugs at the top or bottom of the stairs. If you do have throw rugs, attach them to the floor with carpet tape.  Make sure that you have a light switch at  the top of the stairs and the bottom of the stairs. If you do not have them, ask someone to add them for you. What else can I do to help prevent falls?  Wear shoes that:  Do not have high heels.  Have rubber bottoms.  Are comfortable and fit you well.  Are closed at the toe. Do not wear sandals.  If you use a stepladder:  Make sure that it is fully opened. Do not climb a closed stepladder.  Make sure that both sides of the stepladder are locked into place.  Ask someone to hold it for you, if possible.  Clearly mark and make sure that you can see:  Any grab bars or handrails.  First and last steps.  Where the edge of each step is.  Use tools that help you move around (mobility aids) if they are needed. These include:  Canes.  Walkers.  Scooters.  Crutches.  Turn on the lights when you go into a dark area. Replace any light bulbs as soon as they burn out.  Set up your furniture so you have a clear path. Avoid moving your furniture around.  If any of your floors are uneven, fix them.  If there are any pets around you, be aware of where they are.  Review your medicines with your doctor. Some medicines can make you feel dizzy. This can increase your chance of falling. Ask your doctor what other things that you can do to help prevent falls. This information is not intended to replace advice given to you by your health care provider. Make sure you discuss any questions you have with your health care provider. Document Released: 05/04/2009 Document Revised: 12/14/2015 Document Reviewed: 08/12/2014 Elsevier Interactive Patient Education  2017 [employer].

## 2017-12-31 NOTE — Progress Notes (Signed)
Subjective:   Janet Francis is a 62 y.o. female who presents for an Initial Medicare Annual Wellness Visit       Objective:    Today's Vitals   12/31/17 0846  BP: (!) 180/105  Pulse: 84  Temp: 98 F (36.7 C)  TempSrc: Oral  SpO2: 98%  Weight: 207 lb (93.9 kg)  Height: 5\' 6"  (1.676 m)   Body mass index is 33.41 kg/m.  Advanced Directives 12/31/2017 04/01/2017 01/08/2017 01/08/2017 01/08/2017 01/08/2017  Does Patient Have a Medical Advance Directive? Yes No - No No No  Type of Advance Directive Healthcare Power of Attorney - - - - -  Does patient want to make changes to medical advance directive? No - Patient declined - - - - -  Copy of Healthcare Power of Attorney in Chart? Yes - - - - -  Would patient like information on creating a medical advance directive? - No - Patient declined (No Data) No - Patient declined No - Patient declined No - Patient declined    Current Medications (verified) Outpatient Encounter Medications as of 12/31/2017  Medication Sig  . Multiple Vitamin (MULTIVITAMIN) tablet Take 1 tablet by mouth daily.  . Tdap (BOOSTRIX) 5-2.5-18.5 LF-MCG/0.5 injection Inject 0.5 mLs into the muscle once.  Marland Kitchen amLODipine (NORVASC) 10 MG tablet Take 1 tablet (10 mg total) by mouth daily. (Patient not taking: Reported on 07/01/2017)  . lisinopril (PRINIVIL,ZESTRIL) 10 MG tablet Take 1 tablet (10 mg total) by mouth daily. (Patient not taking: Reported on 07/01/2017)  . risperiDONE (RISPERDAL) 2 MG tablet Take 1 tablet (2 mg total) by mouth every morning. (Patient not taking: Reported on 09/30/2017)  . risperiDONE (RISPERDAL) 3 MG tablet Take 1 tablet (3 mg total) by mouth at bedtime. (Patient not taking: Reported on 07/01/2017)   No facility-administered encounter medications on file as of 12/31/2017.     Allergies (verified) Vicodin [hydrocodone-acetaminophen]; Influenza vaccines; and Morphine and related   History: Past Medical History:  Diagnosis Date  .  Hypertension   . Paranoid schizophrenia [facility])    Past Surgical History:  Procedure Laterality Date  . CHOLECYSTECTOMY     Family History  Problem Relation Age of Onset  . Heart attack Mother   . Colon cancer Father   . Cancer Sister    Social History   Socioeconomic History  . Marital status: Single    Spouse name: Not on file  . Number of children: Not on file  . Years of education: Not on file  . Highest education level: Not on file  Occupational History  . Not on file  Social Needs  . Financial resource strain: Not hard at all  . Food insecurity:    Worry: Never true    Inability: Never true  . Transportation needs:    Medical: No    Non-medical: No  Tobacco Use  . Smoking status: Never Smoker  . Smokeless tobacco: Never Used  Substance and Sexual Activity  . Alcohol use: No  . Drug use: No  . Sexual activity: Not Currently  Lifestyle  . Physical activity:    Days per week: 5 days    Minutes per session: 20 min  . Stress: Not at all  Relationships  . Social connections:    Talks on phone: More than three times a week    Gets together: More than three times a week    Attends religious service: Never    Active member of club or organization: No  Attends meetings of clubs or organizations: Never    Relationship status: Never married  Other Topics Concern  . Not on file  Social History Narrative   Social History      Diet? m[employer]ds      Do you drink/eat things with caffeine? yes      Marital status?                divorced                    What year were you married?      Do you live in a house, apartment, assisted living, condo, trailer, etc.? apartment      Is it one or more stories? 1      How many persons live in your home? 3      Do you have any pets in your home? (please list) no      Highest level of education completed? college      Current or past profession:aircraft      Do you exercise?          yes                             Type & how often? daily      Advanced Directives      Do you have a living will?      Do you have a DNR form?                                  If not, do you want to discuss one?      Do you have signed POA/HPOA for forms?       Functional Status (completed by child)      Do you have difficulty bathing or dressing yourself? no      Do you have difficulty preparing food or eating? no      Do you have difficulty managing your medications? yes      Do you have difficulty managing your finances? yes      Do you have difficulty affording your medications? no    Tobacco Counseling Counseling given: Not Answered   Clinical Intake:  Pre-visit preparation completed: No  Pain : No/denies pain     Nutritional Risks: None Diabetes: No  How often do you need to have someone help you when you read instructions, pamphlets, or other written materials from your doctor or pharmacy?: 1 - Never  Interpreter Needed?: No  Information entered by :: Tyron RussellSara Khamiya Varin, RN   Activities of Daily Living In your present state of health, do you have any difficulty performing the following activities: 12/31/2017 01/08/2017  Hearing? N N  Vision? N N  Difficulty concentrating or making decisions? Y N  Walking or climbing stairs? N N  Dressing or bathing? N N  Doing errands, shopping? N -  Preparing Food and eating ? N -  Using the Toilet? N -  In the past six months, have you accidently leaked urine? Y -  Do you have problems with loss of bowel control? N -  Managing your Medications? N -  Managing your Finances? N -  Housekeeping or managing your Housekeeping? N -     Immunizations and Health Maintenance  There is no immunization history on file for this patient. There are no preventive care  reminders to display for this patient.  Patient Care Team: Kirt Boys, DO as PCP - General (Internal Medicine)  Indicate any recent Medical Services you may have received from other than  [facility] providers in the past year (date may be approximate).     Assessment:   This is a routine wellness examination for [name].  Hearing/Vision screen Hearing Screening Comments: Patient reports no issues with hearing Vision Screening Comments: Sees eye doctor annually  Dietary issues and exercise activities discussed: Current Exercise Habits: Home exercise routine, Type of exercise: walking, Time (Minutes): 20, Frequency (Times/Week): 5, Weekly Exercise (Minutes/Week): 100, Intensity: Mild, Exercise limited by: None identified  Goals    None     Depression Screen PHQ 2/9 Scores 12/31/2017 07/01/2017  PHQ - 2 Score 0 0    Fall Risk Fall Risk  12/31/2017 09/30/2017 07/01/2017  Falls in the past year? No No No    Is the patient's home free of loose throw rugs in walkways, pet beds, electrical cords, etc?   yes      Grab bars in the bathroom? no      Handrails on the stairs?   yes      Adequate lighting?   yes  Cognitive Function: Under age 41        Screening Tests Health Maintenance  Topic Date Due  . Hepatitis C Screening  07/01/2018 (Originally December 31, 1955)  . HIV Screening  07/01/2018 (Originally 03/[birth year]/1970)  . MAMMOGRAM  07/23/2023 (Originally 03/13/2005)  . COLONOSCOPY  07/23/2023 (Originally 03/13/2005)  . PAP SMEAR  07/01/2024 (Originally 03/13/1976)  . TETANUS/TDAP  07/01/2024 (Originally 03/13/1974)  . INFLUENZA VACCINE  07/22/2078 (Originally 02/19/2018)    Qualifies for Shingles Vaccine? Yes, educated and declined  Cancer Screenings: Lung: Low Dose CT Chest recommended if Age 37-80 years, 30 pack-year currently smoking OR have quit w/in 15years. Patient does not qualify. Breast: Up to date on Mammogram? No, declined Up to date of Bone Density/Dexa? Yes Colorectal: due, cologuard information filled out and faxed  Additional Screenings:  Hepatitis C Screening: declined TDAP due: ordered     Plan:    I have personally reviewed and addressed the Medicare  Annual Wellness questionnaire and have noted the following in the patient's chart:  A. Medical and social history B. Use of alcohol, tobacco or illicit drugs  C. Current medications and supplements D. Functional ability and status E.  Nutritional status F.  Physical activity G. Advance directives H. List of other physicians I.  Hospitalizations, surgeries, and ER visits in previous 12 months J.  Vitals K. Screenings to include hearing, vision, cognitive, depression L. Referrals and appointments - none  In addition, I have reviewed and discussed with patient certain preventive protocols, quality metrics, and best practice recommendations. A written personalized care plan for preventive services as well as general preventive health recommendations were provided to patient.  See attached scanned questionnaire for additional information.   Signed,   Tyron Russell, RN Nurse Health Advisor  Patient Concerns: L side ache for months at night   Provider notified that patient does not take any of her prescription medications and that she is hearing loud voices all the time at her apartment while her family does not
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
## 2017-12-31 NOTE — Patient Instructions (Signed)
STOP EATING AT [employer] to help with weight loss  Reduce calories to 1500 calories per day  Watch fatty foods  Increase exercise as tolerated - walk 15 minutes per day  Continue current medications as ordered  Follow up in 3 mos fpr HTN, cholesterol and schizophrenia

## 2018-01-03 LAB — COLOGUARD

## 2018-01-27 ENCOUNTER — Telehealth: Payer: Self-pay

## 2018-01-27 NOTE — Telephone Encounter (Signed)
Pt appears on [facility] roster but has never been seen there.  I called the pt to confirm her PCP. She stated that her daughter handles all of her affairs, and she will have her to call me. VDM (DD)

## 2018-01-27 NOTE — Telephone Encounter (Signed)
The patient's daughter called me back and confirmed that her mom sees Dr. Montez Moritaarter at [facility].  There is another MRN for the same patient.  I submitted a ticket to have it corrected. VDM (DD)

## 2018-02-03 ENCOUNTER — Other Ambulatory Visit: Payer: Self-pay | Admitting: Internal Medicine

## 2018-02-03 DIAGNOSIS — F259 Schizoaffective disorder, unspecified: Secondary | ICD-10-CM

## 2018-02-04 ENCOUNTER — Other Ambulatory Visit: Payer: Self-pay

## 2018-02-04 DIAGNOSIS — F259 Schizoaffective disorder, unspecified: Secondary | ICD-10-CM

## 2018-02-04 MED ORDER — RISPERIDONE 2 MG PO TABS: 2.0000 mg | ORAL_TABLET | Freq: Every morning | ORAL | 2 refills | Status: DC

## 2018-02-27 ENCOUNTER — Other Ambulatory Visit: Payer: Self-pay | Admitting: Internal Medicine

## 2018-02-27 DIAGNOSIS — I10 Essential (primary) hypertension: Secondary | ICD-10-CM

## 2018-03-11 ENCOUNTER — Encounter: Payer: Self-pay | Admitting: Internal Medicine

## 2018-04-10 ENCOUNTER — Encounter (HOSPITAL_COMMUNITY): Payer: Self-pay | Admitting: Emergency Medicine

## 2018-04-10 ENCOUNTER — Ambulatory Visit (HOSPITAL_COMMUNITY)
Admission: EM | Admit: 2018-04-10 | Discharge: 2018-04-10 | Disposition: A | Discharge status: Home / Self Care | Payer: Medicare Other | Attending: Family | Admitting: Family

## 2018-04-10 DIAGNOSIS — M5441 Lumbago with sciatica, right side: Secondary | ICD-10-CM | POA: Diagnosis not present

## 2018-04-10 DIAGNOSIS — I10 Essential (primary) hypertension: Secondary | ICD-10-CM

## 2018-04-10 DIAGNOSIS — M5442 Lumbago with sciatica, left side: Secondary | ICD-10-CM

## 2018-04-10 MED ORDER — DICLOFENAC SODIUM 75 MG PO TBEC: 75.0000 mg | DELAYED_RELEASE_TABLET | Freq: Two times a day (BID) | ORAL | 0 refills | Status: AC | PRN

## 2018-04-10 NOTE — ED Triage Notes (Signed)
Pt states she has had back pain for the last few days. Hurts worse with bending over.

## 2018-04-10 NOTE — Discharge Instructions (Addendum)
Recommend trial Diclofenac 75mg  twice a day as needed for pain. Apply warm compresses to area for comfort. May also take Tylenol 1000mg  every 8 hours as needed for pain. Perform back exercises to help strengthen back muscles. Follow-up with your PCP in 3 to 4 days if not improving.

## 2018-04-11 NOTE — ED Provider Notes (Signed)
[facility]    CSN: [id number] Arrival date & time: 04/10/18  1143     History   Chief Complaint Chief Complaint  Patient presents with  . Back Pain    HPI Janet Francis is a 62 y.o. female.   62 year old female accompanied by a caregiver with concern over lower back pain for the past 4 days. Pain does radiate. Yesterday to left leg, today to right leg. Describes the pain as throbbing when moving and dull when sitting. Pain increases when changing positions or trying to get up from a sitting position. Does not recall any specific injury. Denies any fever, abdominal pain, dysuria, nausea, vomiting, diarrhea or constipation. Has tried applying OTC generic Bengay ointment with some success. Also has history of HTN but caregiver indicated that she did not take her medication the past 2 to 3 days. Patient uncertain whether she took medication and is a poor historian. Patient became more aggravated towards the nurse when she took her blood pressure (automated machine first, then manual BP). Also has history of schizophrenia and currently on Risperdal. Caregiver asking about prescribing medications and Tramadol (specifically) for patient.   The history is provided by the patient and a caregiver.    Past Medical History:  Diagnosis Date  . Hypertension   . Paranoid schizophrenia [facility])     Patient Active Problem List   Diagnosis Date Noted  . Mixed hyperlipidemia 12/31/2017  . Obesity (BMI 30.0-34.9) 12/31/2017  . Bloating 04/02/2017  . Essential hypertension 04/01/2017  . Thyromegaly 04/01/2017  . Schizoaffective disorder (HCC) 04/01/2017  . High risk medication use 04/01/2017  . Paranoid schizophrenia (HCC) 01/09/2017  . Accelerated hypertension 01/09/2017    Past Surgical History:  Procedure Laterality Date  . CHOLECYSTECTOMY      OB History   None      Home Medications    Prior to Admission medications   Medication Sig Start Date End Date  Taking? Authorizing Provider  amLODipine (NORVASC) 10 MG tablet TAKE 1 TABLET BY MOUTH DAILY 02/27/18   Kirt Boys, DO  diclofenac (VOLTAREN) 75 MG EC tablet Take 1 tablet (75 mg total) by mouth 2 (two) times daily as needed for moderate pain. 04/10/18   Sudie Grumbling, NP  lisinopril (PRINIVIL,ZESTRIL) 10 MG tablet TAKE 1 TABLET BY MOUTH DAILY 02/27/18   Kirt Boys, DO  Multiple Vitamin (MULTIVITAMIN) tablet Take 1 tablet by mouth daily.    [provider]  risperiDONE (RISPERDAL) 2 MG tablet Take 1 tablet (2 mg total) by mouth every morning. 02/04/18   Kirt Boys, DO  risperiDONE (RISPERDAL) 3 MG tablet TAKE 1 TABLET BY MOUTH AT BEDTIME 02/04/18   Kirt Boys, DO  Tdap (BOOSTRIX) 5-2.5-18.5 LF-MCG/0.5 injection Inject 0.5 mLs into the muscle once.    [provider]    Family History Family History  Problem Relation Age of Onset  . Heart attack Mother   . Colon cancer Father   . Cancer Sister     Social History Social History   Tobacco Use  . Smoking status: Never Smoker  . Smokeless tobacco: Never Used  Substance Use Topics  . Alcohol use: No  . Drug use: No     Allergies   Vicodin [hydrocodone-acetaminophen]; Influenza vaccines; and Morphine and related   Review of Systems Review of Systems  Constitutional: Negative for activity change, appetite change, chills, fatigue, fever and unexpected weight change.  HENT: Negative for congestion, facial swelling, sore throat and trouble  swallowing.   Respiratory: Negative for cough, chest tightness, shortness of breath and wheezing.   Cardiovascular: Negative for chest pain and palpitations.  Gastrointestinal: Negative for abdominal pain, constipation, diarrhea, nausea and vomiting.  Genitourinary: Negative for decreased urine volume, difficulty urinating, dysuria, flank pain, hematuria and urgency.  Musculoskeletal: Positive for back pain and myalgias. Negative for arthralgias, gait problem, neck pain  and neck stiffness.  Skin: Negative for color change, rash and wound.  Allergic/Immunologic: Negative for immunocompromised state.  Neurological: Negative for dizziness, tremors, seizures, syncope, speech difficulty, weakness, light-headedness, numbness and headaches.  Hematological: Negative for adenopathy. Does not bruise/bleed easily.  Psychiatric/Behavioral: Positive for agitation.     Physical Exam Triage Vital Signs ED Triage Vitals [04/10/18 1207]  Enc Vitals Group     BP (!) 211/66     Pulse Rate 99     Resp 18     Temp 98.1 F (36.7 C)     Temp src      SpO2 100 %     Weight      Height      Head Circumference      Peak Flow      Pain Score 5     Pain Loc      Pain Edu?      Excl. in GC?    No data found.  Updated Vital Signs BP (!) 198/110 (BP Location: Right Arm)   Pulse 99   Temp 98.1 F (36.7 C)   Resp 18   SpO2 100%   Visual Acuity Right Eye Distance:   Left Eye Distance:   Bilateral Distance:    Right Eye Near:   Left Eye Near:    Bilateral Near:     Physical Exam  Constitutional: She is oriented to person, place, and time. She appears well-developed and well-nourished. She is cooperative. She does not appear ill. No distress.  Patient sitting comfortably on exam table in no acute distress.   HENT:  Head: Normocephalic and atraumatic.  Right Ear: External ear normal.  Left Ear: External ear normal.  Eyes: Conjunctivae and EOM are normal.  Neck: Normal range of motion. Neck supple.  Cardiovascular: Normal rate, regular rhythm and normal heart sounds.  No murmur heard. Pulmonary/Chest: Effort normal and breath sounds normal. No respiratory distress. She has no decreased breath sounds. She has no wheezes. She has no rhonchi. She has no rales.  Musculoskeletal: Normal range of motion. She exhibits tenderness.       Lumbar back: She exhibits tenderness and pain. She exhibits normal range of motion, no swelling, no edema, no deformity, no  laceration, no spasm and normal pulse.       Back:  Has full range of motion of back but with increased pain with flexion and full extension. Lower lumbar muscle tenderness present bilaterally. No rash, redness or swelling. Normal reflexes. No neuro deficits noted.   Neurological: She is alert and oriented to person, place, and time. She has normal strength and normal reflexes. No sensory deficit. She displays a negative Romberg sign. Gait normal.  Skin: Skin is warm and dry. Capillary refill takes less than 2 seconds. No rash noted.  Psychiatric: Her speech is normal. Thought content normal. Her mood appears anxious. She is agitated.  Vitals reviewed.    UC Treatments / Results  Labs (all labs ordered are listed, but only abnormal results are displayed) Labs Reviewed - No data to display  EKG None  Radiology No results found.  Procedures Procedures (including critical care time)  Medications Ordered in UC Medications - No data to display  Initial Impression / Assessment and Plan / UC Course  I have reviewed the triage vital signs and the nursing notes.  Pertinent labs & imaging results that were available during my care of the patient were reviewed by me and considered in my medical decision making (see chart for details).    Discussed with patient and caregiver that she appears to have a lumbar back strain with intermittent bilateral sciatica. Discussed that we can treat with NSAIDs but she needs to take her blood pressure medication since BP is extremely high, even though patient is asymptomatic. Discussed that since she has not tried any medication, Tramadol would not be a first choice. Recommend trial Voltaren 75mg  twice a day as directed. May also take Tylenol 1000mg  every 8 hours as needed. Apply warm compresses to area for comfort and may continue topical therapies such as OTC Bengay as needed. Follow-up with her PCP in 3 to 4 days if not improving or go to the ER ASAP if  pain or symptoms worsen.  Final Clinical Impressions(s) / UC Diagnoses   Final diagnoses:  Acute bilateral low back pain with bilateral sciatica  Elevated blood pressure reading with diagnosis of hypertension     Discharge Instructions     Recommend trial Diclofenac 75mg  twice a day as needed for pain. Apply warm compresses to area for comfort. May also take Tylenol 1000mg  every 8 hours as needed for pain. Perform back exercises to help strengthen back muscles. Follow-up with your PCP in 3 to 4 days if not improving.     ED Prescriptions    Medication Sig Dispense Auth. Provider   diclofenac (VOLTAREN) 75 MG EC tablet Take 1 tablet (75 mg total) by mouth 2 (two) times daily as needed for moderate pain. 20 tablet Sudie GrumblingAmyot, Cathlene Gardella Berry, NP     Controlled Substance Prescriptions [place] Controlled Substance Registry consulted? Yes, I have consulted the [place] Controlled Substances Registry for this patient, and feel a controlled substance is not indicated at this time. No active Rx listed.    Sudie GrumblingAmyot, Annalyssa Thune Berry, NP 04/11/18 [id number]

## 2018-04-14 ENCOUNTER — Ambulatory Visit: Payer: Medicare Other | Admitting: Internal Medicine

## 2018-04-29 ENCOUNTER — Other Ambulatory Visit: Payer: Self-pay | Admitting: Internal Medicine

## 2018-04-29 DIAGNOSIS — F259 Schizoaffective disorder, unspecified: Secondary | ICD-10-CM

## 2018-06-02 ENCOUNTER — Telehealth: Payer: Self-pay

## 2018-06-08 ENCOUNTER — Telehealth: Payer: Self-pay

## 2018-06-08 NOTE — Telephone Encounter (Signed)
error 

## 2018-06-08 NOTE — Telephone Encounter (Signed)
Informed daughter that past cologuard sample was unable to be processed. I asked if they would like to try again with another sample and she declined for the time being

## 2018-06-29 ENCOUNTER — Other Ambulatory Visit: Payer: Self-pay | Admitting: *Deleted

## 2018-08-20 ENCOUNTER — Other Ambulatory Visit: Payer: Self-pay | Admitting: *Deleted

## 2018-08-20 DIAGNOSIS — F259 Schizoaffective disorder, unspecified: Secondary | ICD-10-CM

## 2018-08-20 MED ORDER — RISPERIDONE 3 MG PO TABS: 3.0000 mg | ORAL_TABLET | Freq: Every day | ORAL | 0 refills | Status: DC

## 2018-08-20 NOTE — Telephone Encounter (Signed)
Pt has scheduled appts with dinah but needs to be seen sooner she was supposed to follow up in 3 months- please see if we can get her in with dinah sooner than June. I will refill her rx but will need appt. Thank you

## 2018-08-20 NOTE — Telephone Encounter (Signed)
Appointment scheduled for 08/21/2018 with [facility]

## 2018-08-20 NOTE — Telephone Encounter (Signed)
[employer] [facility] Rx and sent to [employer] for approval due to [employer].

## 2018-08-21 ENCOUNTER — Ambulatory Visit (INDEPENDENT_AMBULATORY_CARE_PROVIDER_SITE_OTHER): Payer: Medicare Other | Admitting: Family

## 2018-08-21 ENCOUNTER — Encounter: Payer: Self-pay | Admitting: Family

## 2018-08-21 VITALS — BP 220/100 | HR 112 | Temp 98.2°F | Resp 18 | Ht 66.0 in | Wt 222.0 lb

## 2018-08-21 DIAGNOSIS — E669 Obesity, unspecified: Secondary | ICD-10-CM | POA: Diagnosis not present

## 2018-08-21 DIAGNOSIS — I10 Essential (primary) hypertension: Secondary | ICD-10-CM | POA: Diagnosis not present

## 2018-08-21 DIAGNOSIS — F259 Schizoaffective disorder, unspecified: Secondary | ICD-10-CM | POA: Diagnosis not present

## 2018-08-21 DIAGNOSIS — E782 Mixed hyperlipidemia: Secondary | ICD-10-CM | POA: Diagnosis not present

## 2018-08-21 LAB — COMPLETE METABOLIC PANEL WITH GFR
AG Ratio: 1.3 (calc) (ref 1.0–2.5)
ALT: 54 U/L — ABNORMAL HIGH (ref 6–29)
AST: 42 U/L — ABNORMAL HIGH (ref 10–35)
Albumin: 4.2 g/dL (ref 3.6–5.1)
Alkaline phosphatase (APISO): 59 U/L (ref 33–130)
BILIRUBIN TOTAL: 0.4 mg/dL (ref 0.2–1.2)
BUN/Creatinine Ratio: 11 (calc) (ref 6–22)
BUN: 13 mg/dL (ref 7–25)
CO2: 29 mmol/L (ref 20–32)
Calcium: 9.6 mg/dL (ref 8.6–10.4)
Chloride: 103 mmol/L (ref 98–110)
Creat: 1.18 mg/dL — ABNORMAL HIGH (ref 0.50–0.99)
GFR, Est African American: 57 mL/min/{1.73_m2} — ABNORMAL LOW (ref >=60)
GFR, Est Non African American: 49 mL/min/{1.73_m2} — ABNORMAL LOW (ref >=60)
GLUCOSE: 109 mg/dL — AB (ref 65–99)
Globulin: 3.2 g/dL (calc) (ref 1.9–3.7)
Potassium: 4.4 mmol/L (ref 3.5–5.3)
Sodium: 140 mmol/L (ref 135–146)
Total Protein: 7.4 g/dL (ref 6.1–8.1)

## 2018-08-21 LAB — CBC WITH DIFFERENTIAL/PLATELET
ABSOLUTE MONOCYTES: 515 {cells}/uL (ref 200–950)
Basophils Absolute: 31 cells/uL (ref 0–200)
Basophils Relative: 0.5 %
Eosinophils Absolute: 167 cells/uL (ref 15–500)
Eosinophils Relative: 2.7 %
HCT: 42.4 % (ref 35.0–45.0)
Hemoglobin: 14.3 g/dL (ref 11.7–15.5)
Lymphs Abs: 2685 cells/uL (ref 850–3900)
MCH: 28 pg (ref 27.0–33.0)
MCHC: 33.7 g/dL (ref 32.0–36.0)
MCV: 83.1 fL (ref 80.0–100.0)
MPV: 11.4 fL (ref 7.5–12.5)
Monocytes Relative: 8.3 %
Neutro Abs: 2802 cells/uL (ref 1500–7800)
Neutrophils Relative %: 45.2 %
Platelets: 217 10*3/uL (ref 140–400)
RBC: 5.1 10*6/uL (ref 3.80–5.10)
RDW: 13.2 % (ref 11.0–15.0)
TOTAL LYMPHOCYTE: 43.3 %
WBC: 6.2 10*3/uL (ref 3.8–10.8)

## 2018-08-21 LAB — LIPID PANEL
Cholesterol: 212 mg/dL — ABNORMAL HIGH (ref <200)
HDL: 43 mg/dL — ABNORMAL LOW (ref >50)
LDL Cholesterol (Calc): 135 mg/dL (calc) — ABNORMAL HIGH
Non-HDL Cholesterol (Calc): 169 mg/dL (calc) — ABNORMAL HIGH (ref <130)
TRIGLYCERIDES: 195 mg/dL — AB (ref <150)
Total CHOL/HDL Ratio: 4.9 (calc) (ref <5.0)

## 2018-08-21 LAB — TSH: TSH: 0.49 m[IU]/L (ref 0.40–4.50)

## 2018-08-21 MED ORDER — CLONIDINE HCL 0.1 MG PO TABS
0.1000 mg | ORAL_TABLET | Freq: Once | ORAL | Status: AC
  Administered 2018-08-21: 0.1 mg via ORAL

## 2018-08-21 NOTE — Progress Notes (Signed)
Provider: Marlowe Sax FNP-C   Kaytlyn Din, Nelda Bucks, NP  Patient Care Team: Emmilynn Marut, Nelda Bucks, NP as PCP - General (Family Medicine)  Extended Emergency Contact Information Primary Emergency Contact: Levonne Lapping [place] Phone: [phone] Relation: Daughter  Goals of care: Advanced Directive information Advanced Directives 08/21/2018  Does Patient Have a Medical Advance Directive? Yes  Type of Advance Directive [place]  Does patient want to make changes to medical advance directive? No - Patient declined  Copy of [place] in Chart? Yes - validated most recent copy scanned in chart (See row information)  Would patient like information on creating a medical advance directive? -     Chief Complaint  Patient presents with  . Follow-up    Follow up on chronic issues, patient states she does not need medication and refuses to take medication.     HPI:  Pt is a 63 y.o. female seen today for medical management of chronic diseases.she is here escorted by her daughter.Patient denies any acute issues during visit.she states does take any of her medication at home.Her blood pressure elevated during visit.she denies any headache,dizziness,shortness of breath or chest pain.Clonidine 0.1 mg tablet given x 1 dose.B/p rechecked 200/100.second dose of Clonidine 0.1 mg tablet given.patient left states will take B/P medication as directed.   Hyperlipidemia - latest chol 204,HDL 49,LDL 130 (09/30/2017).current not on any medication.she states has been going to the Gym to exercise.she states does not eat any vegetables or fruits in her diet.  Schizoaffective disorder - states not taking her medication.No behavioral issues reported by daughter.   Obesity - exercises in the Gym.           Past Medical History:  Diagnosis Date  . Hypertension   . Paranoid schizophrenia [facility])    Past Surgical History:  Procedure Laterality Date    . CHOLECYSTECTOMY      Allergies  Allergen Reactions  . Vicodin [Hydrocodone-Acetaminophen] Rash    Difficulty breathing   . Influenza Vaccines     Patient states last flu vaccine caused SOB   . Morphine And Related     difficulty breathing     Allergies as of 08/21/2018      Reactions   Vicodin [hydrocodone-acetaminophen] Rash   Difficulty breathing    Influenza Vaccines    Patient states last flu vaccine caused SOB    Morphine And Related    difficulty breathing       Medication List       Accurate as of August 21, 2018  9:49 AM. Always use your most recent med list.        amLODipine 10 MG tablet Commonly known as:  NORVASC TAKE 1 TABLET BY MOUTH DAILY   diclofenac 75 MG EC tablet Commonly known as:  VOLTAREN Take 1 tablet (75 mg total) by mouth 2 (two) times daily as needed for moderate pain.   lisinopril 10 MG tablet Commonly known as:  PRINIVIL,ZESTRIL TAKE 1 TABLET BY MOUTH DAILY   multivitamin tablet Take 1 tablet by mouth daily.   risperiDONE 2 MG tablet Commonly known as:  RISPERDAL TAKE 1 TABLET BY MOUTH EVERY MORNING   risperiDONE 3 MG tablet Commonly known as:  RISPERDAL Take 1 tablet (3 mg total) by mouth at bedtime.   Tdap 5-2.5-18.5 LF-MCG/0.5 injection Commonly known as:  BOOSTRIX Inject 0.5 mLs into the muscle once.       Review of Systems  Constitutional: Negative for  activity change, appetite change, chills, fatigue and fever.  HENT: Negative for congestion, hearing loss, postnasal drip, rhinorrhea, sinus pressure, sinus pain, sneezing and sore throat.   Eyes: Negative for pain, discharge, redness, itching and visual disturbance.  Respiratory: Negative for cough, chest tightness, shortness of breath and wheezing.   Cardiovascular: Negative for chest pain, palpitations and leg swelling.  Gastrointestinal: Negative for abdominal distention, abdominal pain, constipation, diarrhea, nausea and vomiting.  Endocrine: Negative for cold  intolerance, heat intolerance, polydipsia, polyphagia and polyuria.  Genitourinary: Negative for dysuria, flank pain, frequency and urgency.  Musculoskeletal: Negative for arthralgias and gait problem.  Skin: Negative for color change, pallor and rash.  Neurological: Negative for dizziness, weakness, light-headedness, numbness and headaches.  Psychiatric/Behavioral: Positive for behavioral problems. Negative for agitation and sleep disturbance. The patient is not nervous/anxious.      There is no immunization history on file for this patient. Pertinent  Health Maintenance Due  Topic Date Due  . MAMMOGRAM  07/23/2023 (Originally 03/13/2005)  . COLONOSCOPY  07/23/2023 (Originally 03/13/2005)  . PAP SMEAR-Modifier  07/01/2024 (Originally 03/13/1976)  . INFLUENZA VACCINE  07/22/2078 (Originally 02/19/2018)   Fall Risk  12/31/2017 12/31/2017 09/30/2017 07/01/2017  Falls in the past year? No No No No    Vitals:   08/21/18 0901  BP: (!) 220/100  Pulse: (!) 112  Resp: 18  Temp: 98.2 F (36.8 C)  TempSrc: Oral  SpO2: 98%  Weight: 222 lb (100.7 kg)  Height: '5\' 6"'$  (1.676 m)   Body mass index is 35.83 kg/m. Physical Exam Vitals signs reviewed.  Constitutional:      General: She is not in acute distress.    Appearance: She is obese.     Comments: Arguing with daughter during visit.   HENT:     Head: Normocephalic.     Right Ear: Tympanic membrane, ear canal and external ear normal. There is no impacted cerumen.     Left Ear: Tympanic membrane, ear canal and external ear normal. There is no impacted cerumen.     Nose: Nose normal. No congestion or rhinorrhea.     Mouth/Throat:     Mouth: Mucous membranes are moist.     Pharynx: Oropharynx is clear.  Eyes:     General: No scleral icterus.       Right eye: No discharge.        Left eye: No discharge.     Conjunctiva/sclera: Conjunctivae normal.     Pupils: Pupils are equal, round, and reactive to light.  Neck:     Musculoskeletal:  Normal range of motion. No neck rigidity or muscular tenderness.     Vascular: No carotid bruit.  Cardiovascular:     Rate and Rhythm: Normal rate and regular rhythm.     Pulses: Normal pulses.     Heart sounds: No murmur. No friction rub. No gallop.   Pulmonary:     Effort: Pulmonary effort is normal. No respiratory distress.     Breath sounds: Normal breath sounds. No wheezing, rhonchi or rales.  Chest:     Chest wall: No tenderness.  Abdominal:     General: Bowel sounds are normal. There is no distension.     Palpations: Abdomen is soft. There is no mass.     Tenderness: There is no abdominal tenderness. There is no right CVA tenderness, left CVA tenderness, guarding or rebound.  Musculoskeletal: Normal range of motion.        General: No swelling or tenderness.  Right lower leg: No edema.     Left lower leg: No edema.  Lymphadenopathy:     Cervical: No cervical adenopathy.  Skin:    General: Skin is warm and dry.     Coloration: Skin is not pale.     Findings: No erythema or rash.  Neurological:     Mental Status: She is alert and oriented to person, place, and time.     Cranial Nerves: No cranial nerve deficit.     Sensory: No sensory deficit.     Motor: No weakness.     Coordination: Coordination normal.     Gait: Gait normal.  Psychiatric:        Mood and Affect: Affect is angry.        Speech: Speech normal.        Behavior: Behavior is agitated.        Thought Content: Thought content normal.        Cognition and Memory: Cognition and memory normal.        Judgment: Judgment is inappropriate.     Labs reviewed: Recent Labs    09/30/17 0934  NA 142  K 4.8  CL 107  CO2 29  GLUCOSE CANCELED  CANCELED  103*  BUN 20  CREATININE 1.08*  CALCIUM 9.4   Recent Labs    09/30/17 0934  ALT 17   No results for input(s): WBC, NEUTROABS, HGB, HCT, MCV, PLT in the last 8760 hours. Lab Results  Component Value Date   TSH 0.81 04/01/2017   No results found  for: HGBA1C Lab Results  Component Value Date   CHOL 204 (H) 09/30/2017   HDL 49 (L) 09/30/2017   LDLCALC 130 (H) 09/30/2017   LDLCALC CANCELED 09/30/2017   LDLCALC CANCELED 09/30/2017   TRIG 140 09/30/2017   CHOLHDL 4.2 09/30/2017    Significant Diagnostic Results in last 30 days:  No results found.  Assessment/Plan 1. Essential hypertension Elevated blood pressure reading during visit clonidine 0.1 mg tablet given twice.Not taking medication at home.Discussed importance of taking blood pressure medication to prevent cardiovascular events.verbalized understanding.Encouraged to check blood pressure at home. Easily agitated and argues with daughter.continue current medication.  - CBC with Differential/Platelet - CMP with eGFR([facility]) - TSH  2. Mixed hyperlipidemia Not taking her medication. - Lipid panel  3. Schizoaffective disorder, unspecified type ([place]) Not taking risperdal.encouraged to take medication as directed.   4. Obesity (BMI 30.0-34.9) Exercises daily in the gym daily.encouraged low carbohydrate,low saturated fats and high vegetable diet.also encouraged to drink 6-8 glasses of water daily to stay hydrated.  Family/ staff Communication: Reviewed plan of care with patient and daughter.   Labs/tests ordered: CBC/diff,CMP,TSH, Lipid panel today.   Sandrea Hughs, NP

## 2018-08-21 NOTE — Patient Instructions (Signed)

## 2018-08-24 ENCOUNTER — Other Ambulatory Visit: Payer: Self-pay

## 2018-08-24 DIAGNOSIS — E7800 Pure hypercholesterolemia, unspecified: Secondary | ICD-10-CM

## 2018-08-24 DIAGNOSIS — R7989 Other specified abnormal findings of blood chemistry: Secondary | ICD-10-CM

## 2018-08-24 DIAGNOSIS — R945 Abnormal results of liver function studies: Principal | ICD-10-CM

## 2018-09-22 ENCOUNTER — Other Ambulatory Visit: Payer: Medicare Other

## 2019-01-06 ENCOUNTER — Encounter: Payer: Medicare Other | Admitting: Family

## 2019-01-06 ENCOUNTER — Encounter: Payer: Self-pay | Admitting: Family

## 2019-01-06 ENCOUNTER — Ambulatory Visit: Payer: Self-pay

## 2019-04-09 ENCOUNTER — Other Ambulatory Visit: Payer: Self-pay

## 2019-04-09 ENCOUNTER — Encounter: Payer: Self-pay | Admitting: Family

## 2019-04-09 ENCOUNTER — Ambulatory Visit (INDEPENDENT_AMBULATORY_CARE_PROVIDER_SITE_OTHER): Payer: Medicare Other | Admitting: Family

## 2019-04-09 VITALS — BP 150/78 | HR 91 | Temp 98.5°F | Resp 20 | Ht 66.0 in | Wt 229.2 lb

## 2019-04-09 DIAGNOSIS — I10 Essential (primary) hypertension: Secondary | ICD-10-CM | POA: Diagnosis not present

## 2019-04-09 DIAGNOSIS — E782 Mixed hyperlipidemia: Secondary | ICD-10-CM | POA: Diagnosis not present

## 2019-04-09 DIAGNOSIS — E010 Iodine-deficiency related diffuse (endemic) goiter: Secondary | ICD-10-CM

## 2019-04-09 DIAGNOSIS — R350 Frequency of micturition: Secondary | ICD-10-CM | POA: Diagnosis not present

## 2019-04-09 DIAGNOSIS — F259 Schizoaffective disorder, unspecified: Secondary | ICD-10-CM

## 2019-04-09 LAB — POCT URINALYSIS DIPSTICK
Glucose, UA: NEGATIVE
Ketones, UA: NEGATIVE
Leukocytes, UA: NEGATIVE
Protein, UA: POSITIVE — AB
Spec Grav, UA: 1.02 (ref 1.010–1.025)
Urobilinogen, UA: 0.2 E.U./dL
pH, UA: 6.5 (ref 5.0–8.0)

## 2019-04-09 MED ORDER — LISINOPRIL 10 MG PO TABS: 10.0000 mg | ORAL_TABLET | Freq: Every day | ORAL | 0 refills | Status: DC

## 2019-04-09 MED ORDER — RISPERIDONE 3 MG PO TABS: 3.0000 mg | ORAL_TABLET | Freq: Every day | ORAL | 0 refills | Status: DC

## 2019-04-09 MED ORDER — RISPERIDONE 2 MG PO TABS: 2.0000 mg | ORAL_TABLET | Freq: Every morning | ORAL | 0 refills | Status: DC

## 2019-04-09 MED ORDER — AMLODIPINE BESYLATE 10 MG PO TABS: 10.0000 mg | ORAL_TABLET | Freq: Every day | ORAL | 0 refills | Status: DC

## 2019-04-09 NOTE — Progress Notes (Addendum)
Provider: Richarda Blade FNP-C   Monifa Blanchette, Donalee Citrin, NP  Patient Care Team: Ramonda Galyon, Donalee Citrin, NP as PCP - General (Family Medicine)  Extended Emergency Contact Information Primary Emergency Contact: Ivin Poot [place] of [place] Home Phone: [phone] Relation: Daughter  Code Status:  Goals of care: Advanced Directive information Advanced Directives 08/21/2018  Does Patient Have a Medical Advance Directive? Yes  Type of Advance Directive Healthcare Power of Attorney  Does patient want to make changes to medical advance directive? No - Patient declined  Copy of Healthcare Power of Attorney in Chart? Yes - validated most recent copy scanned in chart (See row information)  Would patient like information on creating a medical advance directive? -     Chief Complaint  Patient presents with  . Medical Management of Chronic Issues    Routine visit visit of medical management    HPI:  Pt is a 63 y.o. female seen today  for medical management of chronic diseases.she denies any acute issues.she has a medical history of hypertension,hyperlipidemia,Thyroidmegaly,schizoaffective disorder.she is here with her daughter.she states does not take any of her medication.Her blood pressure is 150/78.Does not check blood pressure at home.she argues with her daughter Debbe Odea during visit.She states has had swelkling on her ankles but attributes it to " glue in the food".  She also complains of urine frequency with some lower abdominal pain sometimes.Patient states drinks plenty of water but daughter states drinks soda's.she has urine leakage though states stopped wearing pads.Daughter states still wearing pads. HPI is very limited due to contradictory information.she denies any fever,chills,or back pain.      Past Medical History:  Diagnosis Date  . Hypertension   . Paranoid schizophrenia [facility])    Past Surgical History:  Procedure Laterality Date  . CHOLECYSTECTOMY      Allergies   Allergen Reactions  . Vicodin [Hydrocodone-Acetaminophen] Rash    Difficulty breathing   . Influenza Vaccines     Patient states last flu vaccine caused SOB   . Morphine And Related     difficulty breathing     Allergies as of 04/09/2019      Reactions   Vicodin [hydrocodone-acetaminophen] Rash   Difficulty breathing    Influenza Vaccines    Patient states last flu vaccine caused SOB    Morphine And Related    difficulty breathing       Medication List       Accurate as of April 09, 2019  2:29 PM. If you have any questions, ask your nurse or doctor.        amLODipine 10 MG tablet Commonly known as: NORVASC TAKE 1 TABLET BY MOUTH DAILY   diclofenac 75 MG EC tablet Commonly known as: VOLTAREN Take 1 tablet (75 mg total) by mouth 2 (two) times daily as needed for moderate pain.   lisinopril 10 MG tablet Commonly known as: ZESTRIL TAKE 1 TABLET BY MOUTH DAILY   multivitamin tablet Take 1 tablet by mouth daily.   risperiDONE 2 MG tablet Commonly known as: RISPERDAL TAKE 1 TABLET BY MOUTH EVERY MORNING   risperiDONE 3 MG tablet Commonly known as: RISPERDAL Take 1 tablet (3 mg total) by mouth at bedtime.   Tdap 5-2.5-18.5 LF-MCG/0.5 injection Commonly known as: BOOSTRIX Inject 0.5 mLs into the muscle once.       Review of Systems  Constitutional: Negative for appetite change, chills, fatigue and fever.  HENT: Negative for congestion, rhinorrhea, sinus pressure, sinus pain, sneezing, sore throat and trouble swallowing.  Daughter states chokes when eating but patient denies.  Eyes: Negative for discharge, redness, itching and visual disturbance.  Respiratory: Negative for cough, chest tightness, shortness of breath and wheezing.   Cardiovascular: Negative for chest pain and palpitations.       Swells on the ankle at times but none today   Gastrointestinal: Negative for abdominal distention, abdominal pain, constipation, diarrhea, nausea and vomiting.   Endocrine: Negative for cold intolerance, heat intolerance, polydipsia, polyphagia and polyuria.  Genitourinary: Positive for frequency. Negative for difficulty urinating, dysuria, flank pain and urgency.       Urine leakage wears a pad   Musculoskeletal: Positive for arthralgias. Negative for gait problem, neck pain and neck stiffness.  Skin: Negative for color change, pallor, rash and wound.  Neurological: Negative for dizziness, weakness, light-headedness, numbness and headaches.  Hematological: Does not bruise/bleed easily.  Psychiatric/Behavioral: Positive for agitation. Negative for sleep disturbance. The patient is not nervous/anxious.      There is no immunization history on file for this patient. Pertinent  Health Maintenance Due  Topic Date Due  . MAMMOGRAM  07/23/2023 (Originally 03/13/2005)  . COLONOSCOPY  07/23/2023 (Originally 03/13/2005)  . PAP SMEAR-Modifier  07/01/2024 (Originally 03/13/1976)  . INFLUENZA VACCINE  07/22/2078 (Originally 02/20/2019)   Fall Risk  04/09/2019 12/31/2017 12/31/2017 09/30/2017 07/01/2017  Falls in the past year? 0 No No No No  Injury with Fall? 0 - - - -    Vitals:   04/09/19 1416  BP: (!) 150/78  Pulse: 91  Resp: 20  Temp: 98.5 F (36.9 C)  TempSrc: Oral  SpO2: 98%  Weight: 229 lb 3.2 oz (104 kg)  Height: 5\' 6"  (1.676 m)   Body mass index is 36.99 kg/m. Physical Exam Vitals signs reviewed.  Constitutional:      General: She is not in acute distress.    Appearance: She is obese. She is not ill-appearing.  HENT:     Nose: Nose normal. No congestion or rhinorrhea.     Mouth/Throat:     Mouth: Mucous membranes are moist.     Pharynx: Oropharynx is clear. No oropharyngeal exudate or posterior oropharyngeal erythema.  Eyes:     General: No scleral icterus.       Right eye: No discharge.        Left eye: No discharge.     Extraocular Movements: Extraocular movements intact.     Conjunctiva/sclera: Conjunctivae normal.     Pupils:  Pupils are equal, round, and reactive to light.  Neck:     Musculoskeletal: Normal range of motion. No neck rigidity or muscular tenderness.     Vascular: No carotid bruit.  Cardiovascular:     Rate and Rhythm: Normal rate and regular rhythm.     Pulses: Normal pulses.     Heart sounds: Normal heart sounds. No murmur. No friction rub. No gallop.   Pulmonary:     Effort: Pulmonary effort is normal. No respiratory distress.     Breath sounds: Normal breath sounds. No wheezing, rhonchi or rales.  Chest:     Chest wall: No tenderness.  Abdominal:     General: Bowel sounds are normal. There is no distension.     Palpations: Abdomen is soft. There is no mass.     Tenderness: There is no abdominal tenderness. There is no right CVA tenderness, left CVA tenderness, guarding or rebound.  Musculoskeletal: Normal range of motion.        General: No swelling, tenderness or deformity.  Right lower leg: No edema.     Left lower leg: No edema.  Lymphadenopathy:     Cervical: No cervical adenopathy.  Skin:    General: Skin is warm and dry.     Coloration: Skin is not pale.     Findings: No bruising, erythema, lesion or rash.  Neurological:     Mental Status: She is alert and oriented to person, place, and time.     Cranial Nerves: No cranial nerve deficit.     Sensory: No sensory deficit.     Motor: No weakness.     Coordination: Coordination normal.     Gait: Gait normal.  Psychiatric:        Mood and Affect: Mood is not anxious or depressed. Affect is inappropriate. Affect is not tearful.        Speech: Speech normal.        Behavior: Behavior is cooperative.        Thought Content: Thought content normal.        Judgment: Judgment normal.     Comments: Argues with daughter during visit     Labs reviewed: Recent Labs    08/21/18 0951  NA 140  K 4.4  CL 103  CO2 29  GLUCOSE 109*  BUN 13  CREATININE 1.18*  CALCIUM 9.6   Recent Labs    08/21/18 0951  AST 42*  ALT 54*   BILITOT 0.4  PROT 7.4   Recent Labs    08/21/18 0951  WBC 6.2  NEUTROABS 2,802  HGB 14.3  HCT 42.4  MCV 83.1  PLT 217   Lab Results  Component Value Date   TSH 0.49 08/21/2018   No results found for: HGBA1C Lab Results  Component Value Date   CHOL 212 (H) 08/21/2018   HDL 43 (L) 08/21/2018   LDLCALC 135 (H) 08/21/2018   TRIG 195 (H) 08/21/2018   CHOLHDL 4.9 08/21/2018    Significant Diagnostic Results in last 30 days:  No results found.  Assessment/Plan 1. Essential hypertension Not taking her medication.B/p not at goal.encouraged to take medication as directed but declines.Has active labs to be done will return when fasting.  2. Mixed hyperlipidemia Has active labs to be done will return when fasting.Encouraged to continue to exercise and modify her diet.   3. Schizoaffective disorder, unspecified type ([facility]) Not taking medication.Argues with daughter  4. Thyromegaly Pending acive labs.  5. Urine Frequency  Afebrile.Urine drip stick shows cloudy urine,moderate blood,Positive for protein,nitrite and leukocytes negative with strong odor.will send urine for culture and sensitivity.   Family/ staff Communication: Reviewed plan of care with patient and daughter.  Labs/tests ordered: Has active labs to be done will return when fasting.  Caesar Bookmaninah C Nalleli Largent, NP

## 2019-04-09 NOTE — Patient Instructions (Signed)
1. Keep legs elevated when seated to keep swelling down.  2.Take your medication as directed

## 2019-04-10 LAB — URINE CULTURE
MICRO NUMBER:: 899339
Result:: NO GROWTH
SPECIMEN QUALITY:: ADEQUATE

## 2019-05-18 ENCOUNTER — Other Ambulatory Visit: Payer: Self-pay

## 2019-05-18 ENCOUNTER — Other Ambulatory Visit: Payer: Medicare Other

## 2019-05-18 DIAGNOSIS — R7989 Other specified abnormal findings of blood chemistry: Secondary | ICD-10-CM

## 2019-05-18 DIAGNOSIS — E7800 Pure hypercholesterolemia, unspecified: Secondary | ICD-10-CM

## 2019-05-18 LAB — COMPLETE METABOLIC PANEL WITH GFR
AG Ratio: 1.4 (calc) (ref 1.0–2.5)
ALT: 52 U/L — ABNORMAL HIGH (ref 6–29)
AST: 36 U/L — ABNORMAL HIGH (ref 10–35)
Albumin: 4.2 g/dL (ref 3.6–5.1)
Alkaline phosphatase (APISO): 49 U/L (ref 37–153)
BUN/Creatinine Ratio: 9 (calc) (ref 6–22)
BUN: 10 mg/dL (ref 7–25)
CO2: 28 mmol/L (ref 20–32)
Calcium: 9.3 mg/dL (ref 8.6–10.4)
Chloride: 103 mmol/L (ref 98–110)
Creat: 1.07 mg/dL — ABNORMAL HIGH (ref 0.50–0.99)
GFR, Est African American: 64 mL/min/{1.73_m2} (ref >=60)
GFR, Est Non African American: 55 mL/min/{1.73_m2} — ABNORMAL LOW (ref >=60)
Globulin: 3 g/dL (calc) (ref 1.9–3.7)
Glucose, Bld: 115 mg/dL — ABNORMAL HIGH (ref 65–99)
Potassium: 4.2 mmol/L (ref 3.5–5.3)
Sodium: 140 mmol/L (ref 135–146)
Total Bilirubin: 0.6 mg/dL (ref 0.2–1.2)
Total Protein: 7.2 g/dL (ref 6.1–8.1)

## 2019-05-18 LAB — LIPID PANEL
Cholesterol: 195 mg/dL (ref <200)
HDL: 44 mg/dL — ABNORMAL LOW (ref >=50)
LDL Cholesterol (Calc): 125 mg/dL (calc) — ABNORMAL HIGH
Non-HDL Cholesterol (Calc): 151 mg/dL (calc) — ABNORMAL HIGH (ref <130)
Total CHOL/HDL Ratio: 4.4 (calc) (ref <5.0)
Triglycerides: 149 mg/dL (ref <150)

## 2019-06-01 ENCOUNTER — Other Ambulatory Visit: Payer: Self-pay | Admitting: Family

## 2019-06-01 DIAGNOSIS — F259 Schizoaffective disorder, unspecified: Secondary | ICD-10-CM

## 2019-06-02 ENCOUNTER — Other Ambulatory Visit: Payer: Self-pay

## 2019-06-02 DIAGNOSIS — I10 Essential (primary) hypertension: Secondary | ICD-10-CM

## 2019-06-02 MED ORDER — LISINOPRIL 10 MG PO TABS: 10.0000 mg | ORAL_TABLET | Freq: Every day | ORAL | 0 refills | Status: DC

## 2019-06-02 MED ORDER — AMLODIPINE BESYLATE 10 MG PO TABS: 10.0000 mg | ORAL_TABLET | Freq: Every day | ORAL | 0 refills | Status: DC

## 2019-06-07 ENCOUNTER — Other Ambulatory Visit: Payer: Self-pay | Admitting: *Deleted

## 2019-06-07 DIAGNOSIS — I10 Essential (primary) hypertension: Secondary | ICD-10-CM

## 2019-06-07 MED ORDER — LISINOPRIL 10 MG PO TABS: 10.0000 mg | ORAL_TABLET | Freq: Every day | ORAL | 0 refills | Status: DC

## 2019-06-07 MED ORDER — AMLODIPINE BESYLATE 10 MG PO TABS: 10.0000 mg | ORAL_TABLET | Freq: Every day | ORAL | 0 refills | Status: DC

## 2019-06-07 NOTE — Telephone Encounter (Signed)
Patient requested refill Faxed to pharmacy.  

## 2019-06-24 ENCOUNTER — Other Ambulatory Visit: Payer: Self-pay | Admitting: Internal Medicine

## 2019-06-24 ENCOUNTER — Other Ambulatory Visit: Payer: Self-pay

## 2019-06-24 DIAGNOSIS — Z1231 Encounter for screening mammogram for malignant neoplasm of breast: Secondary | ICD-10-CM

## 2019-07-14 ENCOUNTER — Other Ambulatory Visit: Payer: Self-pay

## 2019-07-14 NOTE — Patient Outreach (Signed)
[facility]) Care Management  07/14/2019  Janet Francis September 01, 1955 [id number]   Medication Adherence call to [name] spoke with patients care giver she explain patient is taking Lisinopril 10 mg,she explain patient has medication at this time.[facility] said last pick up was on 03/2019 for a 90 days. [name] is showing past due under [place].  [facility] Direct Dial [phone]  Fax [phone] [name]

## 2020-03-17 ENCOUNTER — Other Ambulatory Visit: Payer: Self-pay | Admitting: Family

## 2020-03-17 DIAGNOSIS — I10 Essential (primary) hypertension: Secondary | ICD-10-CM

## 2020-03-20 ENCOUNTER — Other Ambulatory Visit: Payer: Self-pay | Admitting: Family

## 2020-03-20 DIAGNOSIS — I10 Essential (primary) hypertension: Secondary | ICD-10-CM

## 2020-03-20 NOTE — Telephone Encounter (Signed)
I agree fill amlodipine x 30 days then will need office visit/lab work  prior to next medication refill.

## 2020-03-20 NOTE — Telephone Encounter (Signed)
Patient was sent in 30 day supply. Patient was called and I spoke to daughter "Regan Rakers". Daughter states that patient can't make appointment right now because of her work schedule. So she will call back. I informed daughter that 30 day supply will be sent in, however patient wont receive anymore refills until appointment is scheduled. Note also sent into pharmacy as well.

## 2020-03-30 ENCOUNTER — Other Ambulatory Visit: Payer: Self-pay | Admitting: Family

## 2020-03-30 DIAGNOSIS — F259 Schizoaffective disorder, unspecified: Secondary | ICD-10-CM

## 2020-03-30 NOTE — Telephone Encounter (Signed)
Will refill Risperdal for one month with no refill.will not fill again until you come for a visit at the office and get blood work done.please schedule follow up appointment as soon as possible.

## 2020-03-30 NOTE — Telephone Encounter (Signed)
Refill request received. Patient does not have any scheduled upcoming appointments is this medication ok to fill. Please advise.

## 2020-03-30 NOTE — Telephone Encounter (Signed)
Spoke with Janet Francis (daughter) and I informed her that resperidone 3 mg would only be filled for 30 days.   Janet Francis stated: Will refill Risperdal for one month with no refill.will not fill again until you come for a visit at the office and get blood work done.please schedule follow up appointment as soon as possible.   Daughter stated that she would call back to schedule appointment once she looks at her schedule.

## 2020-04-26 ENCOUNTER — Other Ambulatory Visit: Payer: Self-pay | Admitting: Family

## 2020-04-26 DIAGNOSIS — F259 Schizoaffective disorder, unspecified: Secondary | ICD-10-CM

## 2020-09-29 ENCOUNTER — Other Ambulatory Visit: Payer: Self-pay

## 2020-09-29 ENCOUNTER — Ambulatory Visit (INDEPENDENT_AMBULATORY_CARE_PROVIDER_SITE_OTHER): Payer: Medicare Other | Admitting: Family

## 2020-09-29 ENCOUNTER — Encounter: Payer: Self-pay | Admitting: Family

## 2020-09-29 ENCOUNTER — Ambulatory Visit
Admission: RE | Admit: 2020-09-29 | Discharge: 2020-09-29 | Disposition: A | Discharge status: Home / Self Care | Payer: Medicare Other | Source: Ambulatory Visit | Attending: Family | Admitting: Family

## 2020-09-29 VITALS — BP 182/104 | HR 98 | Temp 97.3°F | Resp 18 | Ht 66.0 in | Wt 220.0 lb

## 2020-09-29 DIAGNOSIS — I10 Essential (primary) hypertension: Secondary | ICD-10-CM | POA: Diagnosis not present

## 2020-09-29 DIAGNOSIS — M25561 Pain in right knee: Secondary | ICD-10-CM | POA: Diagnosis not present

## 2020-09-29 DIAGNOSIS — W19XXXA Unspecified fall, initial encounter: Secondary | ICD-10-CM

## 2020-09-29 MED ORDER — CLONIDINE HCL 0.1 MG PO TABS
0.1000 mg | ORAL_TABLET | Freq: Once | ORAL | Status: AC
  Administered 2020-09-29: 0.1 mg via ORAL

## 2020-09-29 MED ORDER — AMLODIPINE BESYLATE 10 MG PO TABS: 10.0000 mg | ORAL_TABLET | Freq: Every day | ORAL | 1 refills | Status: DC

## 2020-09-29 NOTE — Progress Notes (Signed)
Provider: Richarda Blade FNP-C  Jala Dundon, Donalee Citrin, NP  Patient Care Team: Simon Aaberg, Donalee Citrin, NP as PCP - General (Family Medicine)  Extended Emergency Contact Information Primary Emergency Contact: Ivin Poot [place] Home Phone: [phone] Relation: Daughter  Code Status: Full Code  Goals of care: Advanced Directive information Advanced Directives 08/21/2018  Does Patient Have a Medical Advance Directive? Yes  Type of Advance Directive Healthcare Power of Attorney  Does patient want to make changes to medical advance directive? No - Patient declined  Copy of Healthcare Power of Attorney in Chart? Yes - validated most recent copy scanned in chart (See row information)  Would patient like information on creating a medical advance directive? -     Chief Complaint  Patient presents with  . Acute Visit    Right knee pain and discuss refill on medication    HPI:  Pt is a 65 y.o. female seen today for an acute visit for evaluation of right knee pain for 1-2 months.States a dog pulled her when trying to go up on the steps.she fell flat on her knee about 2 months ago. Pain is worst when walking up the steps and sitting in the car.Has to stand to stretch leg.No swelling or redness noted. Also denies any weakness or numbness on the leg.  Blood pressure elevated today.daughter states not taking her Blood pressure medication.States when she takes medication blood pressure gets too low.    Past Medical History:  Diagnosis Date  . Hypertension   . Paranoid schizophrenia [facility])    Past Surgical History:  Procedure Laterality Date  . CHOLECYSTECTOMY      Allergies  Allergen Reactions  . Vicodin [Hydrocodone-Acetaminophen] Rash    Difficulty breathing   . Influenza Vaccines     Patient states last flu vaccine caused SOB   . Morphine And Related     difficulty breathing     Outpatient Encounter Medications as of 09/29/2020  Medication Sig  . amLODipine  (NORVASC) 10 MG tablet Take 1 tablet (10 mg total) by mouth daily.  . diclofenac (VOLTAREN) 75 MG EC tablet Take 1 tablet (75 mg total) by mouth 2 (two) times daily as needed for moderate pain.  Marland Kitchen lisinopril (ZESTRIL) 10 MG tablet Take 1 tablet (10 mg total) by mouth daily.  . Multiple Vitamin (MULTIVITAMIN) tablet Take 1 tablet by mouth daily.  . risperiDONE (RISPERDAL) 2 MG tablet Take 1 tablet (2 mg total) by mouth every morning.  . risperiDONE (RISPERDAL) 3 MG tablet TAKE 1 TABLET(3 MG) BY MOUTH AT BEDTIME  . Tdap (BOOSTRIX) 5-2.5-18.5 LF-MCG/0.5 injection Inject 0.5 mLs into the muscle once.  . [DISCONTINUED] amLODipine (NORVASC) 10 MG tablet TAKE 1 TABLET(10 MG) BY MOUTH DAILY  . [EXPIRED] cloNIDine (CATAPRES) tablet 0.1 mg   . [EXPIRED] cloNIDine (CATAPRES) tablet 0.1 mg    No facility-administered encounter medications on file as of 09/29/2020.    Review of Systems  Constitutional: Negative for appetite change, chills, fatigue and fever.  Eyes: Negative for discharge, redness and itching.  Respiratory: Negative for cough, chest tightness, shortness of breath and wheezing.   Cardiovascular: Negative for chest pain, palpitations and leg swelling.  Gastrointestinal: Negative for abdominal distention, abdominal pain, constipation, diarrhea, nausea and vomiting.  Musculoskeletal: Positive for arthralgias. Negative for joint swelling, myalgias and neck pain.       Right knee pain   Skin: Negative for color change and pallor.  Neurological: Negative for dizziness, speech difficulty, weakness, light-headedness, numbness and  headaches.  Hematological: Does not bruise/bleed easily.  Psychiatric/Behavioral: Negative for agitation, behavioral problems and sleep disturbance. The patient is not nervous/anxious.     Immunization History  Administered Date(s) Administered  . PFIZER(Purple Top)SARS-COV-2 Vaccination 12/14/2019, 01/04/2020   Pertinent  Health Maintenance Due  Topic Date Due  .  DEXA SCAN  Never done  . MAMMOGRAM  07/23/2023 (Originally 03/13/2005)  . COLONOSCOPY (Pts 45-63yrs Insurance coverage will need to be confirmed)  07/23/2023 (Originally 03/13/2000)  . PAP SMEAR-Modifier  07/01/2024 (Originally 03/13/1976)  . INFLUENZA VACCINE  07/22/2078 (Originally 02/20/2020)  . PNA vac Low Risk Adult  Discontinued   Fall Risk  09/29/2020 04/09/2019 12/31/2017 12/31/2017 09/30/2017  Falls in the past year? 1 0 No No No  Number falls in past yr: 0 - - - -  Injury with Fall? 1 0 - - -   Functional Status Survey:    Vitals:   09/29/20 1307 09/29/20 1428  BP: (!) 200/100 (!) 182/104  Pulse: 98   Resp: 18   Temp: (!) 97.3 F (36.3 C)   SpO2: 97%   Weight: 220 lb (99.8 kg)   Height: 5\' 6"  (1.676 m)    Body mass index is 35.51 kg/m. Physical Exam Vitals reviewed.  Constitutional:      General: She is not in acute distress.    Appearance: She is obese. She is not ill-appearing.  HENT:     Head: Normocephalic.  Eyes:     General: No scleral icterus.       Right eye: No discharge.        Left eye: No discharge.     Conjunctiva/sclera: Conjunctivae normal.     Pupils: Pupils are equal, round, and reactive to light.  Neck:     Vascular: No carotid bruit.  Cardiovascular:     Rate and Rhythm: Normal rate and regular rhythm.     Pulses: Normal pulses.     Heart sounds: Normal heart sounds. No murmur heard. No friction rub. No gallop.   Pulmonary:     Effort: Pulmonary effort is normal. No respiratory distress.     Breath sounds: Normal breath sounds. No wheezing, rhonchi or rales.  Chest:     Chest wall: No tenderness.  Abdominal:     General: Bowel sounds are normal. There is no distension.     Palpations: Abdomen is soft. There is no mass.     Tenderness: There is no abdominal tenderness. There is no right CVA tenderness, left CVA tenderness, guarding or rebound.  Musculoskeletal:        General: No swelling or tenderness.     Cervical back: Normal range of  motion. No rigidity or tenderness.     Right knee: Crepitus present. No swelling, effusion, erythema or ecchymosis. Normal range of motion. No tenderness.     Left knee: No swelling, effusion, erythema or ecchymosis. Normal range of motion. No tenderness.     Right lower leg: No edema.     Left lower leg: No edema.  Lymphadenopathy:     Cervical: No cervical adenopathy.  Neurological:     Mental Status: She is alert.  Psychiatric:        Mood and Affect: Mood normal.        Speech: Speech normal.     Labs reviewed: No results for input(s): NA, K, CL, CO2, GLUCOSE, BUN, CREATININE, CALCIUM, MG, PHOS in the last 8760 hours. No results for input(s): AST, ALT, ALKPHOS, BILITOT, PROT, ALBUMIN in  the last 8760 hours. No results for input(s): WBC, NEUTROABS, HGB, HCT, MCV, PLT in the last 8760 hours. Lab Results  Component Value Date   TSH 0.49 08/21/2018   No results found for: HGBA1C Lab Results  Component Value Date   CHOL 195 05/18/2019   HDL 44 (L) 05/18/2019   LDLCALC 125 (H) 05/18/2019   TRIG 149 05/18/2019   CHOLHDL 4.4 05/18/2019    Significant Diagnostic Results in last 30 days:  No results found.  Assessment/Plan  1. Acute pain of right knee Status post fall episode 1-2 months ago.no effusion,erythema or tenderness to palpation. - Take diclofenac 75 mg tablet twice daily as needed for pain.  - DG Knee Complete 4 Views Right; Future  2. Fall, initial encounter Pulled by a dog trying to go up on the steps about 1-2 months.Fell flat on the knees.did not get evaluated.No bruises or wound noted.  - DG Knee Complete 4 Views Right; Future  3. Essential hypertension B/p elevated today.Not taking her blood pressure medication as prescribed missed her routine visit. - amLODipine (NORVASC) 10 MG tablet; Take 1 tablet (10 mg total) by mouth daily.  Dispense: 90 tablet; Refill: 1 - cloNIDine (CATAPRES) tablet 0.1 mg    Family/ staff Communication: Reviewed plan of care  with patient and daughter   Labs/tests ordered: - DG Knee Complete 4 Views Right; Future  Next Appointment: 1 month for medical management of chronic issues and follow up B/p.   Caesar Bookman, NP

## 2020-09-29 NOTE — Patient Instructions (Addendum)
-   Please get right knee X-ray done at [facility] then will call you.  - Take Voltaren as directed for pain  - Restart your blood pressure medication as directed. - check B/p at home and notify provider if B/p is greater than 140/90

## 2020-10-03 ENCOUNTER — Other Ambulatory Visit: Payer: Self-pay

## 2020-10-03 DIAGNOSIS — F259 Schizoaffective disorder, unspecified: Secondary | ICD-10-CM

## 2020-10-03 MED ORDER — RISPERIDONE 2 MG PO TABS: 2.0000 mg | ORAL_TABLET | Freq: Every morning | ORAL | 1 refills | Status: DC

## 2020-10-03 MED ORDER — RISPERIDONE 3 MG PO TABS
ORAL_TABLET | ORAL | 0 refills | Status: DC
Start: 2020-10-03 — End: 2023-01-06

## 2020-10-23 ENCOUNTER — Other Ambulatory Visit: Payer: Self-pay | Admitting: Family

## 2020-10-23 DIAGNOSIS — F259 Schizoaffective disorder, unspecified: Secondary | ICD-10-CM

## 2020-11-02 ENCOUNTER — Other Ambulatory Visit: Payer: Self-pay

## 2020-11-02 ENCOUNTER — Ambulatory Visit (INDEPENDENT_AMBULATORY_CARE_PROVIDER_SITE_OTHER): Payer: Medicare Other | Admitting: Nurse Practitioner

## 2020-11-02 ENCOUNTER — Encounter: Payer: Self-pay | Admitting: Nurse Practitioner

## 2020-11-02 VITALS — BP 170/60 | HR 110 | Temp 97.1°F | Resp 16 | Ht 66.0 in | Wt 220.8 lb

## 2020-11-02 DIAGNOSIS — E782 Mixed hyperlipidemia: Secondary | ICD-10-CM

## 2020-11-02 DIAGNOSIS — E010 Iodine-deficiency related diffuse (endemic) goiter: Secondary | ICD-10-CM

## 2020-11-02 DIAGNOSIS — I10 Essential (primary) hypertension: Secondary | ICD-10-CM | POA: Diagnosis not present

## 2020-11-02 DIAGNOSIS — F259 Schizoaffective disorder, unspecified: Secondary | ICD-10-CM | POA: Diagnosis not present

## 2020-11-02 DIAGNOSIS — R739 Hyperglycemia, unspecified: Secondary | ICD-10-CM

## 2020-11-02 DIAGNOSIS — Z114 Encounter for screening for human immunodeficiency virus [HIV]: Secondary | ICD-10-CM

## 2020-11-02 DIAGNOSIS — R000 Tachycardia, unspecified: Secondary | ICD-10-CM | POA: Diagnosis not present

## 2020-11-02 DIAGNOSIS — Z1159 Encounter for screening for other viral diseases: Secondary | ICD-10-CM

## 2020-11-02 MED ORDER — METOPROLOL TARTRATE 25 MG PO TABS: 25.0000 mg | ORAL_TABLET | Freq: Two times a day (BID) | ORAL | 1 refills | Status: DC

## 2020-11-02 NOTE — Patient Instructions (Signed)
Recommend to start metoprolol 25 mg by mouth twice daily for blood pressure and heart rate  Continue to take norvasc 10 mg by mouth daily

## 2020-11-02 NOTE — Progress Notes (Signed)
Careteam: Patient Care Team: Ngetich, Nelda Bucks, NP as PCP - General (Family Medicine)  PLACE OF SERVICE:  [place] Directive information Does Patient Have a Medical Advance Directive?: No, Would patient like information on creating a medical advance directive?: No - Guardian declined  Allergies  Allergen Reactions  . Vicodin [Hydrocodone-Acetaminophen] Rash    Difficulty breathing   . Influenza Vaccines     Patient states last flu vaccine caused SOB   . Morphine And Related     difficulty breathing     Chief Complaint  Patient presents with  . Health Maintenance    Discuss the need for Hepatitis C Screening, HIV Screening, and Dexa Scan.  . Medical Management of Chronic Issues    1 Month Follow Up.  . Immunizations    Discuss the need for Covid Booster.      HPI: Patient is a 65 y.o. female for routine follow up  She has hx of schizoaffective disorder. She is not taking her risperdal. She gets very angry when discussing her   She has history of hypertension but does not take medication this morning. In office she grabs 2 pills and took them.   She is having knee pain but not taking Voltaren tablet. Daughter plans to make appt.   Denies palpitations, chest pains or shortness of breath but daughter notes she gets short of breath easily with minimal activity.    Review of Systems:  Review of Systems  Unable to perform ROS: Psychiatric disorder   Past Medical History:  Diagnosis Date  . Hypertension   . Paranoid schizophrenia [facility])    Past Surgical History:  Procedure Laterality Date  . CHOLECYSTECTOMY     Social History:   reports that she has never smoked. She has never used smokeless tobacco. She reports that she does not drink alcohol and does not use drugs.  Family History  Problem Relation Age of Onset  . Heart attack Mother   . Colon cancer Father   . Cancer Sister     Medications: Patient's Medications  New Prescriptions   No  medications on file  Previous Medications   AMLODIPINE (NORVASC) 10 MG TABLET    Take 1 tablet (10 mg total) by mouth daily.   DICLOFENAC (VOLTAREN) 75 MG EC TABLET    Take 1 tablet (75 mg total) by mouth 2 (two) times daily as needed for moderate pain.   MULTIPLE VITAMIN (MULTIVITAMIN) TABLET    Take 1 tablet by mouth daily.   RISPERIDONE (RISPERDAL) 2 MG TABLET    Take 1 tablet (2 mg total) by mouth every morning.   RISPERIDONE (RISPERDAL) 3 MG TABLET    TAKE 1 TABLET(3 MG) BY MOUTH AT BEDTIME   TDAP (BOOSTRIX) 5-2.5-18.5 LF-MCG/0.5 INJECTION    Inject 0.5 mLs into the muscle once.  Modified Medications   No medications on file  Discontinued Medications   LISINOPRIL (ZESTRIL) 10 MG TABLET    Take 1 tablet (10 mg total) by mouth daily.    Physical Exam:  Vitals:   11/02/20 0910  BP: (!) 170/60  Pulse: (!) 110  Resp: 16  Temp: (!) 97.1 F (36.2 C)  SpO2: 98%  Weight: 220 lb 12.8 oz (100.2 kg)  Height: _0  (1.676 m)   Body mass index is 35.64 kg/m. Wt Readings from Last 3 Encounters:  11/02/20 220 lb 12.8 oz (100.2 kg)  09/29/20 220 lb (99.8 kg)  04/09/19 229 lb 3.2 oz (104 kg)  Physical Exam Constitutional:      General: She is not in acute distress.    Appearance: She is well-developed. She is not diaphoretic.  HENT:     Head: Normocephalic and atraumatic.     Mouth/Throat:     Pharynx: No oropharyngeal exudate.  Eyes:     Conjunctiva/sclera: Conjunctivae normal.     Pupils: Pupils are equal, round, and reactive to light.  Cardiovascular:     Rate and Rhythm: Regular rhythm. Tachycardia present.     Heart sounds: Normal heart sounds.  Pulmonary:     Effort: Pulmonary effort is normal.     Breath sounds: Normal breath sounds.  Abdominal:     General: Bowel sounds are normal.     Palpations: Abdomen is soft.  Musculoskeletal:        General: No tenderness.     Cervical back: Normal range of motion and neck supple.  Skin:    General: Skin is warm and dry.   Neurological:     Mental Status: She is alert and oriented to person, place, and time.     Labs reviewed: Basic Metabolic Panel: No results for input(s): NA, K, CL, CO2, GLUCOSE, BUN, CREATININE, CALCIUM, MG, PHOS, TSH in the last 8760 hours. Liver Function Tests: No results for input(s): AST, ALT, ALKPHOS, BILITOT, PROT, ALBUMIN in the last 8760 hours. No results for input(s): LIPASE, AMYLASE in the last 8760 hours. No results for input(s): AMMONIA in the last 8760 hours. CBC: No results for input(s): WBC, NEUTROABS, HGB, HCT, MCV, PLT in the last 8760 hours. Lipid Panel: No results for input(s): CHOL, HDL, LDLCALC, TRIG, CHOLHDL, LDLDIRECT in the last 8760 hours. TSH: No results for input(s): TSH in the last 8760 hours. A1C: No results found for: HGBA1C   Assessment/Plan 1. Tachycardia - EKG 12-Lead noted sinus tachycardia, rate 114 with PAC -will follow up lab work at this time to evaluate and start her on metoprolol 25 mg BID.  - TSH - CMP with eGFR([facility]) - metoprolol tartrate (LOPRESSOR) 25 MG tablet; Take 1 tablet (25 mg total) by mouth 2 (two) times daily.  Dispense: 60 tablet; Refill: 1  2. Essential hypertension -improved on recheck but not controlled, educated on importance of taking medication routinely. Encouraged daughter to help with giving her medication -continue on norvasc and will add metoprolol 25 mg BIC - CMP with eGFR([facility]) - CBC with Differential/Platelet - metoprolol tartrate (LOPRESSOR) 25 MG tablet; Take 1 tablet (25 mg total) by mouth 2 (two) times daily.  Dispense: 60 tablet; Refill: 1  3. Mixed hyperlipidemia -not currently on medication, will follow up labs at this time. Fasting.  - CMP with eGFR([facility]) - Lipid Panel  4. Schizoaffective disorder, unspecified type ([place]) paranoid at this time. Daughter reports giving her Risperdal BID, would benefit from psychiatrist.    5. Thyromegaly -will follow up TSH at this time. - TSH  6.  Hyperglycemia -noted on previous labs.  - Hemoglobin A1c  7. Need for hepatitis C screening test - Hepatitis C antibody  8. Encounter for screening for HIV - HIV Antibody (routine testing w rflx)  Next appt: 2 weeks on BP and HR.  Carlos American. [place], [facility] [phone]

## 2020-11-06 ENCOUNTER — Other Ambulatory Visit: Payer: Self-pay | Admitting: Nurse Practitioner

## 2020-11-06 DIAGNOSIS — I10 Essential (primary) hypertension: Secondary | ICD-10-CM

## 2020-11-06 DIAGNOSIS — E0590 Thyrotoxicosis, unspecified without thyrotoxic crisis or storm: Secondary | ICD-10-CM

## 2020-11-06 DIAGNOSIS — R000 Tachycardia, unspecified: Secondary | ICD-10-CM

## 2020-11-06 MED ORDER — POTASSIUM CHLORIDE CRYS ER 20 MEQ PO TBCR: 40.0000 meq | EXTENDED_RELEASE_TABLET | Freq: Once | ORAL | 0 refills | Status: AC

## 2020-11-06 NOTE — Progress Notes (Signed)
Diarrhea may be coming from the thyroid. Prescription was sent in on Thursday at the time of her appt. Will also send in potassium supplement to take potassium 20 meq 2 tablets to boost potassium back up to normal. Will need to recheck at next office visit.

## 2020-11-07 LAB — CBC WITH DIFFERENTIAL/PLATELET
Absolute Monocytes: 566 cells/uL (ref 200–950)
Basophils Absolute: 26 cells/uL (ref 0–200)
Basophils Relative: 0.3 %
Eosinophils Absolute: 44 cells/uL (ref 15–500)
Eosinophils Relative: 0.5 %
HCT: 43 % (ref 35.0–45.0)
Hemoglobin: 14.3 g/dL (ref 11.7–15.5)
Lymphs Abs: 3315 cells/uL (ref 850–3900)
MCH: 27.9 pg (ref 27.0–33.0)
MCHC: 33.3 g/dL (ref 32.0–36.0)
MCV: 83.8 fL (ref 80.0–100.0)
MPV: 11.1 fL (ref 7.5–12.5)
Monocytes Relative: 6.5 %
Neutro Abs: 4750 cells/uL (ref 1500–7800)
Neutrophils Relative %: 54.6 %
Platelets: 211 10*3/uL (ref 140–400)
RBC: 5.13 10*6/uL — ABNORMAL HIGH (ref 3.80–5.10)
RDW: 13.3 % (ref 11.0–15.0)
Total Lymphocyte: 38.1 %
WBC: 8.7 10*3/uL (ref 3.8–10.8)

## 2020-11-07 LAB — HEMOGLOBIN A1C
Hgb A1c MFr Bld: 5.9 % of total Hgb — ABNORMAL HIGH (ref <5.7)
Mean Plasma Glucose: 123 mg/dL
eAG (mmol/L): 6.8 mmol/L

## 2020-11-07 LAB — TSH: TSH: 0.19 mIU/L — ABNORMAL LOW (ref 0.40–4.50)

## 2020-11-07 LAB — COMPLETE METABOLIC PANEL WITH GFR
AG Ratio: 1.3 (calc) (ref 1.0–2.5)
ALT: 36 U/L — ABNORMAL HIGH (ref 6–29)
AST: 30 U/L (ref 10–35)
Albumin: 4.4 g/dL (ref 3.6–5.1)
Alkaline phosphatase (APISO): 54 U/L (ref 37–153)
BUN/Creatinine Ratio: 13 (calc) (ref 6–22)
BUN: 16 mg/dL (ref 7–25)
CO2: 22 mmol/L (ref 20–32)
Calcium: 9.4 mg/dL (ref 8.6–10.4)
Chloride: 105 mmol/L (ref 98–110)
Creat: 1.19 mg/dL — ABNORMAL HIGH (ref 0.50–0.99)
GFR, Est African American: 55 mL/min/{1.73_m2} — ABNORMAL LOW (ref >=60)
GFR, Est Non African American: 48 mL/min/{1.73_m2} — ABNORMAL LOW (ref >=60)
Globulin: 3.3 g/dL (calc) (ref 1.9–3.7)
Glucose, Bld: 144 mg/dL — ABNORMAL HIGH (ref 65–99)
Potassium: 3.3 mmol/L — ABNORMAL LOW (ref 3.5–5.3)
Sodium: 140 mmol/L (ref 135–146)
Total Bilirubin: 0.5 mg/dL (ref 0.2–1.2)
Total Protein: 7.7 g/dL (ref 6.1–8.1)

## 2020-11-07 LAB — TEST AUTHORIZATION

## 2020-11-07 LAB — LIPID PANEL
Cholesterol: 201 mg/dL — ABNORMAL HIGH (ref <200)
HDL: 47 mg/dL — ABNORMAL LOW (ref >=50)
LDL Cholesterol (Calc): 124 mg/dL (calc) — ABNORMAL HIGH
Non-HDL Cholesterol (Calc): 154 mg/dL (calc) — ABNORMAL HIGH (ref <130)
Total CHOL/HDL Ratio: 4.3 (calc) (ref <5.0)
Triglycerides: 183 mg/dL — ABNORMAL HIGH (ref <150)

## 2020-11-07 LAB — HIV ANTIBODY (ROUTINE TESTING W REFLEX): HIV 1&2 Ab, 4th Generation: NONREACTIVE

## 2020-11-07 LAB — T3, FREE: T3, Free: 3 pg/mL (ref 2.3–4.2)

## 2020-11-07 LAB — HEPATITIS C ANTIBODY
Hepatitis C Ab: NONREACTIVE
SIGNAL TO CUT-OFF: 0.01 (ref <1.00)

## 2020-11-07 LAB — T4, FREE: Free T4: 0.9 ng/dL (ref 0.8–1.8)

## 2020-11-22 ENCOUNTER — Ambulatory Visit (INDEPENDENT_AMBULATORY_CARE_PROVIDER_SITE_OTHER): Payer: Medicare Other | Admitting: Orthopedic Surgery

## 2020-11-22 ENCOUNTER — Encounter: Payer: Self-pay | Admitting: Orthopedic Surgery

## 2020-11-22 ENCOUNTER — Other Ambulatory Visit: Payer: Self-pay

## 2020-11-22 VITALS — BP 188/110 | HR 98 | Temp 97.0°F | Ht 66.0 in | Wt 217.0 lb

## 2020-11-22 DIAGNOSIS — E0590 Thyrotoxicosis, unspecified without thyrotoxic crisis or storm: Secondary | ICD-10-CM | POA: Diagnosis not present

## 2020-11-22 DIAGNOSIS — E782 Mixed hyperlipidemia: Secondary | ICD-10-CM

## 2020-11-22 DIAGNOSIS — I10 Essential (primary) hypertension: Secondary | ICD-10-CM

## 2020-11-22 DIAGNOSIS — R000 Tachycardia, unspecified: Secondary | ICD-10-CM

## 2020-11-22 DIAGNOSIS — Z9119 Patient's noncompliance with other medical treatment and regimen: Secondary | ICD-10-CM

## 2020-11-22 DIAGNOSIS — F259 Schizoaffective disorder, unspecified: Secondary | ICD-10-CM

## 2020-11-22 DIAGNOSIS — Z91199 Patient's noncompliance with other medical treatment and regimen due to unspecified reason: Secondary | ICD-10-CM

## 2020-11-22 DIAGNOSIS — E010 Iodine-deficiency related diffuse (endemic) goiter: Secondary | ICD-10-CM

## 2020-11-22 NOTE — Patient Instructions (Signed)

## 2020-11-22 NOTE — Progress Notes (Signed)
Careteam: Patient Care Team: Ngetich, Donalee Citrin, NP as PCP - General (Family Medicine)  Seen by: Hazle Nordmann, AGNP-C  PLACE OF SERVICE:  [facility]  Advanced Directive information    Allergies  Allergen Reactions  . Vicodin [Hydrocodone-Acetaminophen] Rash    Difficulty breathing   . Influenza Vaccines     Patient states last flu vaccine caused SOB   . Morphine And Related     difficulty breathing     Chief Complaint  Patient presents with  . Medical Management of Chronic Issues    Patient returns to the office for her 2 week follow up.     HPI: Patient is a 65 y.o. female seen today for medical management of chronic conditions.   Daughter presents for encounter.   Labs reviewed with patient. Potassium was low last month, daughter picked up potassium tablets from pharmacy, but they were never taken. Lipid panel remains elevated, she refuses to take medication at this time. Does not follow a particular diet, not interested.   Diagnosed with hyperthyroidism last month. Metoprolol ordered and referral to endocrinology made. Scheduled to see [facility] endocrinology 06/27. She continues to have some diarrhea and trouble sleeping at night. Unsure if sleeping issues related to schizophrenia.   Daughter reports she has not been taking her medications due to her schizoaffective disorder. Daily struggle for her to take her medications. In addition, she is not taking them consistently. When I asked her why she does not want to take her medications, she states" I eat a banana, that is all I need." She also states" I am not going to the hospital," " I do not like taking medicine." I have discussed risks of elevated blood pressure with daughter and patient. At this time she denies chest pain, shortness of breath, palpitations, headaches and blurred vision.   Options discussed for psych evaluation. She is refusing evaluation at this time.   No recent falls or injuries.      Review of  Systems:  Review of Systems  Unable to perform ROS: Psychiatric disorder    Past Medical History:  Diagnosis Date  . Hypertension   . Paranoid schizophrenia [facility])    Past Surgical History:  Procedure Laterality Date  . CHOLECYSTECTOMY     Social History:   reports that she has never smoked. She has never used smokeless tobacco. She reports that she does not drink alcohol and does not use drugs.  Family History  Problem Relation Age of Onset  . Heart attack Mother   . Colon cancer Father   . Cancer Sister     Medications: Patient's Medications  New Prescriptions   No medications on file  Previous Medications   AMLODIPINE (NORVASC) 10 MG TABLET    Take 1 tablet (10 mg total) by mouth daily.   DICLOFENAC (VOLTAREN) 75 MG EC TABLET    Take 1 tablet (75 mg total) by mouth 2 (two) times daily as needed for moderate pain.   METOPROLOL TARTRATE (LOPRESSOR) 25 MG TABLET    Take 1 tablet (25 mg total) by mouth 2 (two) times daily.   MULTIPLE VITAMIN (MULTIVITAMIN) TABLET    Take 1 tablet by mouth daily.   POTASSIUM CHLORIDE SA (KLOR-CON) 20 MEQ TABLET    Take 2 tablets (40 mEq total) by mouth once for 1 dose.   RISPERIDONE (RISPERDAL) 2 MG TABLET    Take 1 tablet (2 mg total) by mouth every morning.   RISPERIDONE (RISPERDAL) 3 MG TABLET  TAKE 1 TABLET(3 MG) BY MOUTH AT BEDTIME   TDAP (BOOSTRIX) 5-2.5-18.5 LF-MCG/0.5 INJECTION    Inject 0.5 mLs into the muscle once.  Modified Medications   No medications on file  Discontinued Medications   No medications on file    Physical Exam:  Vitals:   11/22/20 1545  BP: (!) 224/132  Pulse: 98  Temp: (!) 97 F (36.1 C)  SpO2: 100%  Weight: 217 lb (98.4 kg)  Height: 5\' 6"  (1.676 m)   Body mass index is 35.02 kg/m. Wt Readings from Last 3 Encounters:  11/22/20 217 lb (98.4 kg)  11/02/20 220 lb 12.8 oz (100.2 kg)  09/29/20 220 lb (99.8 kg)    Physical Exam Vitals reviewed.  Constitutional:      General: She is not in acute  distress. HENT:     Head: Normocephalic.  Eyes:     General:        Right eye: No discharge.        Left eye: No discharge.  Neck:     Thyroid: Thyromegaly present. No thyroid mass.     Vascular: No carotid bruit.  Cardiovascular:     Rate and Rhythm: Regular rhythm. Tachycardia present.     Pulses: Normal pulses.     Heart sounds: Normal heart sounds. No murmur heard.   Pulmonary:     Effort: Pulmonary effort is normal. No respiratory distress.     Breath sounds: Normal breath sounds. No wheezing.  Abdominal:     General: Bowel sounds are normal. There is no distension.     Palpations: Abdomen is soft.     Tenderness: There is no abdominal tenderness.  Musculoskeletal:     Cervical back: Normal range of motion.     Right lower leg: No edema.     Left lower leg: No edema.  Lymphadenopathy:     Cervical: No cervical adenopathy.  Skin:    General: Skin is warm and dry.     Capillary Refill: Capillary refill takes less than 2 seconds.  Neurological:     General: No focal deficit present.     Mental Status: She is alert. Mental status is at baseline.     Motor: No weakness.     Gait: Gait normal.  Psychiatric:        Attention and Perception: Attention normal.        Mood and Affect: Mood normal. Affect is flat.        Speech: Speech normal.        Behavior: Behavior is uncooperative.    Labs reviewed: Basic Metabolic Panel: Recent Labs    11/02/20 0955  NA 140  K 3.3*  CL 105  CO2 22  GLUCOSE 144*  BUN 16  CREATININE 1.19*  CALCIUM 9.4  TSH 0.19*   Liver Function Tests: Recent Labs    11/02/20 0955  AST 30  ALT 36*  BILITOT 0.5  PROT 7.7   No results for input(s): LIPASE, AMYLASE in the last 8760 hours. No results for input(s): AMMONIA in the last 8760 hours. CBC: Recent Labs    11/02/20 0955  WBC 8.7  NEUTROABS 4,750  HGB 14.3  HCT 43.0  MCV 83.8  PLT 211   Lipid Panel: Recent Labs    11/02/20 0955  CHOL 201*  HDL 47*  LDLCALC 124*   TRIG 183*  CHOLHDL 4.3   TSH: Recent Labs    11/02/20 0955  TSH 0.19*   A1C: Lab Results  Component Value Date   HGBA1C 5.9 (H) 11/02/2020     Assessment/Plan: 1. Hyperthyroidism - TSH 0.19, T3 3.0, T4 0.9, HR elevated  - scheduled to see endocrinology 06/27 - metoprolol 25 mg po bid  2. Tachycardia - due to hyperthyroidism - see above  3. Essential hypertension - uncontrolled- due to medication noncompliance - advised to start metoprolol 25 mg po bid - advised to take blood pressure at home, twice daily- 2 hours after taking metoprolol and record for 1 week - needs f/u in 1 week to assess blood pressure - recommend DASH diet  4. Mixed hyperlipidemia - LDL 124 - she does not want to start medication - states she eats healthy - lipid panel- future  5. Schizoaffective disorder, unspecified type ([facility]) - not taking ripseridone - flat affect - noncompliant with medications - recommend psych referral or present to ED for evaluation- refused by patient today  6. Noncompliance - due to schizoaffcetive disorder - recommend psych consult if medication noncompliance persists - recommend referral to [facility]   Total time: 38 minutes. Greater than 50% of total time spent doing patient education and coordination of care regarding high blood pressure, medication compliance and lab results.    Next appt:Visit date not found Maniya Donovan Coletta Memos, [facility]  [facility] [phone]

## 2020-11-23 ENCOUNTER — Encounter: Payer: Self-pay | Admitting: Orthopedic Surgery

## 2020-11-29 ENCOUNTER — Ambulatory Visit: Payer: Medicare Other | Admitting: Family

## 2020-12-21 ENCOUNTER — Other Ambulatory Visit: Payer: Self-pay | Admitting: Nurse Practitioner

## 2020-12-21 DIAGNOSIS — R000 Tachycardia, unspecified: Secondary | ICD-10-CM

## 2020-12-21 DIAGNOSIS — I10 Essential (primary) hypertension: Secondary | ICD-10-CM

## 2021-01-15 ENCOUNTER — Ambulatory Visit (INDEPENDENT_AMBULATORY_CARE_PROVIDER_SITE_OTHER): Payer: Medicare Other | Admitting: Endocrinology

## 2021-01-15 ENCOUNTER — Other Ambulatory Visit: Payer: Self-pay

## 2021-01-15 ENCOUNTER — Encounter: Payer: Self-pay | Admitting: Endocrinology

## 2021-01-15 DIAGNOSIS — E0590 Thyrotoxicosis, unspecified without thyrotoxic crisis or storm: Secondary | ICD-10-CM

## 2021-01-15 NOTE — Patient Instructions (Addendum)
Let's check the ultrasound.  you will receive a phone call, about a day and time for an appointment.  Your blood pressure is high today.  Please see your primary care provider soon, to have it rechecked.   Please come back for a follow-up appointment in 3 months.

## 2021-01-15 NOTE — Progress Notes (Signed)
Subjective:    Patient ID: Janet Francis, female    DOB: Feb 03, 1956, 65 y.o.   MRN: [id number]  HPI Pt is referred by Abbey Chatters, NP, for hyperthyroidism.  Dtr Wallie Char, DPA-HC).  provides hx, due to pt's memory loss (they live together).  Pt reports he was dx'ed with hyperthyroidism in early 2022.  she has never been on therapy for this, but she took synthroid for a brief time many years ago.  she has never had XRT to the anterior neck, or thyroid surgery.  she has not recently had thyroid imaging.  she does not consume kelp or any other non-prescribed thyroid medication.  she has never been on amiodarone.  Pt says she had the thyroid "drained" in approx 1990, when she lived in [place].  Pt has not recently taken any of her meds.   Past Medical History:  Diagnosis Date   Hypertension    Paranoid schizophrenia (HCC)     Past Surgical History:  Procedure Laterality Date   CHOLECYSTECTOMY      Social History   Socioeconomic History   Marital status: Single    Spouse name: Not on file   Number of children: Not on file   Years of education: Not on file   Highest education level: Not on file  Occupational History   Not on file  Tobacco Use   Smoking status: Never   Smokeless tobacco: Never  Vaping Use   Vaping Use: Never used  Substance and Sexual Activity   Alcohol use: No   Drug use: No   Sexual activity: Not Currently  Other Topics Concern   Not on file  Social History Narrative   Social History      Diet? [employer]      Do you drink/eat things with caffeine? yes      Marital status?                divorced                    What year were you married?      Do you live in a house, apartment, assisted living, condo, trailer, etc.? apartment      Is it one or more stories? 1      How many persons live in your home? 3      Do you have any pets in your home? (please list) no      Highest level of education completed? college      Current or  past profession:aircraft      Do you exercise?          yes                            Type & how often? daily      Advanced Directives      Do you have a living will?      Do you have a DNR form?                                  If not, do you want to discuss one?      Do you have signed POA/HPOA for forms?       Functional Status (completed by child)      Do you have difficulty bathing or dressing yourself? no  Do you have difficulty preparing food or eating? no      Do you have difficulty managing your medications? yes      Do you have difficulty managing your finances? yes      Do you have difficulty affording your medications? no   Social Determinants of Health   Financial Resource Strain: Not on file  Food Insecurity: Not on file  Transportation Needs: Not on file  Physical Activity: Not on file  Stress: Not on file  Social Connections: Not on file  Intimate Partner Violence: Not on file    Current Outpatient Medications on File Prior to Visit  Medication Sig Dispense Refill   amLODipine (NORVASC) 10 MG tablet Take 1 tablet (10 mg total) by mouth daily. 90 tablet 1   diclofenac (VOLTAREN) 75 MG EC tablet Take 1 tablet (75 mg total) by mouth 2 (two) times daily as needed for moderate pain. 20 tablet 0   metoprolol tartrate (LOPRESSOR) 25 MG tablet TAKE 1 TABLET(25 MG) BY MOUTH TWICE DAILY 180 tablet 1   Multiple Vitamin (MULTIVITAMIN) tablet Take 1 tablet by mouth daily.     risperiDONE (RISPERDAL) 3 MG tablet TAKE 1 TABLET(3 MG) BY MOUTH AT BEDTIME 30 tablet 0   Tdap (BOOSTRIX) 5-2.5-18.5 LF-MCG/0.5 injection Inject 0.5 mLs into the muscle once.     potassium chloride SA (KLOR-CON) 20 MEQ tablet Take 2 tablets (40 mEq total) by mouth once for 1 dose. 2 tablet 0   risperiDONE (RISPERDAL) 2 MG tablet Take 1 tablet (2 mg total) by mouth every morning. (Patient not taking: Reported on 11/22/2020) 90 tablet 1   No current facility-administered medications on file  prior to visit.    Allergies  Allergen Reactions   Vicodin [Hydrocodone-Acetaminophen] Rash    Difficulty breathing    Influenza Vaccines     Patient states last flu vaccine caused SOB    Morphine And Related     difficulty breathing     Family History  Problem Relation Age of Onset   Heart attack Mother    Colon cancer Father    Cancer Sister    Thyroid disease Neg Hx     BP (!) 240/100 (BP Location: Right Arm, Patient Position: Sitting, Cuff Size: Large)   Pulse 67   Ht 5\' 6"  (1.676 m)   Wt 217 lb (98.4 kg)   SpO2 97%   BMI 35.02 kg/m     Review of Systems denies excessive diaphoresis, tremor, anxiety, and heat intolerance.  She has weight gain and doe.      Objective:   Physical Exam VS: see vs page GEN: no distress HEAD: head: no deformity eyes: no periorbital swelling, no proptosis external nose and ears are normal NECK: supple, thyroid is enlarged, (R>L), but I can't tell details CHEST WALL: no deformity LUNGS: clear to auscultation CV: reg rate and rhythm, no murmur.  MUSCULOSKELETAL: gait is normal and steady EXTEMITIES: no deformity.  no leg edema NEURO:  readily moves all 4's.  sensation is intact to touch on all 4's.  No tremor SKIN:  Normal texture and temperature.  No rash or suspicious lesion is visible.  Not diaphoretic.   NODES:  None palpable at the neck.  PSYCH: alert, cooperative.  Does not appear anxious nor depressed.     Lab Results  Component Value Date   TSH 0.19 (L) 11/02/2020   I have reviewed outside records, and summarized: Pt was noted to have low TSH, and referred here.  Schizophrenia was interfering with med compliance     Assessment & Plan:  MNG, new to me Hyperthyroidism: in order to eval with nuc med scan, she may need drainage first.  HTN: Pt refuses recheck  Patient Instructions  Let's check the ultrasound.  you will receive a phone call, about a day and time for an appointment.  Your blood pressure is high today.   Please see your primary care provider soon, to have it rechecked.   Please come back for a follow-up appointment in 3 months.

## 2021-01-31 ENCOUNTER — Ambulatory Visit
Admission: RE | Admit: 2021-01-31 | Discharge: 2021-01-31 | Disposition: A | Discharge status: Home / Self Care | Payer: Medicare Other | Source: Ambulatory Visit | Attending: Endocrinology | Admitting: Endocrinology

## 2021-01-31 DIAGNOSIS — E0590 Thyrotoxicosis, unspecified without thyrotoxic crisis or storm: Secondary | ICD-10-CM

## 2021-02-01 ENCOUNTER — Other Ambulatory Visit: Payer: Self-pay | Admitting: Endocrinology

## 2021-02-01 DIAGNOSIS — E042 Nontoxic multinodular goiter: Secondary | ICD-10-CM

## 2021-03-06 ENCOUNTER — Inpatient Hospital Stay: Admission: RE | Admit: 2021-03-06 | Payer: Medicare Other | Source: Ambulatory Visit

## 2021-03-06 ENCOUNTER — Other Ambulatory Visit: Payer: Medicare Other

## 2021-03-21 ENCOUNTER — Other Ambulatory Visit: Payer: Medicare Other

## 2021-04-17 ENCOUNTER — Other Ambulatory Visit: Payer: Self-pay | Admitting: Family

## 2021-04-17 ENCOUNTER — Ambulatory Visit: Payer: Medicare Other | Admitting: Endocrinology

## 2021-04-17 DIAGNOSIS — F259 Schizoaffective disorder, unspecified: Secondary | ICD-10-CM

## 2021-04-17 NOTE — Telephone Encounter (Signed)
Patient has request refill on medication "Risperidone 2mg ". Patient last refill on medication was 10/03/2020. It appears that medication was ended that date. Medication pend and sent to PCP Ngetich, 10/05/2020, NP for further evaluation of this medication.

## 2022-02-01 ENCOUNTER — Other Ambulatory Visit: Payer: Self-pay | Admitting: Family

## 2022-02-01 DIAGNOSIS — F259 Schizoaffective disorder, unspecified: Secondary | ICD-10-CM

## 2022-02-01 NOTE — Telephone Encounter (Signed)
Patient has not been seen in over a year (12/10/20). Rx requested pending for Ngetich, Dinah C, NP approval.   Please advise.

## 2022-08-30 ENCOUNTER — Other Ambulatory Visit: Payer: Self-pay | Admitting: Family

## 2022-08-30 DIAGNOSIS — F259 Schizoaffective disorder, unspecified: Secondary | ICD-10-CM

## 2022-09-16 IMAGING — US US THYROID
1 series · 15 of 25 positions shown · non-contrast
Comparison: None.

CLINICAL DATA: Hyperthyroid.

EXAM:
THYROID ULTRASOUND
TECHNIQUE: Ultrasound examination of the thyroid gland and adjacent soft
tissues was performed.

[Series 1: us thyroid · 0.10mm/px · 15 of 56 slices shown]
[im 1/56]
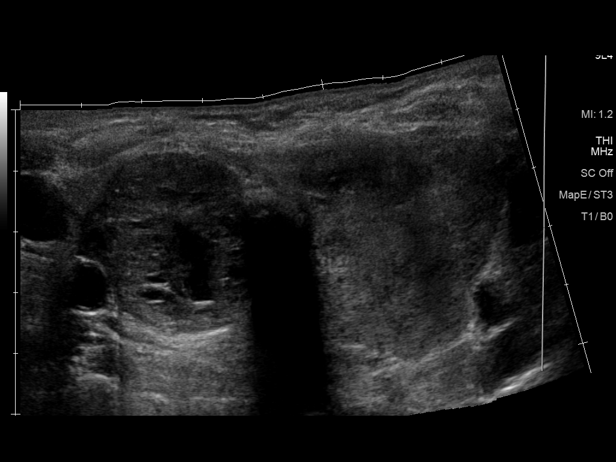
[im 5/56]
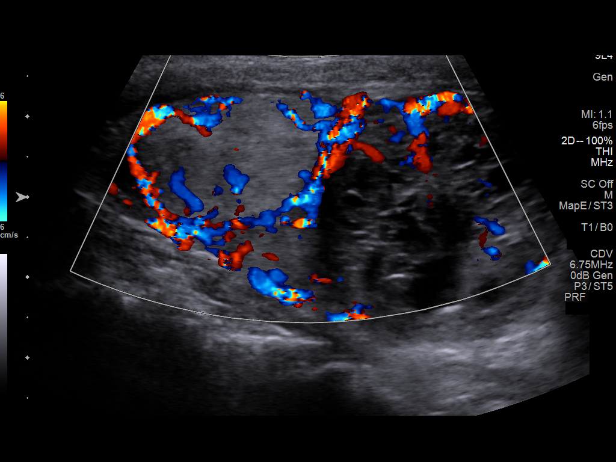
[im 10/56]
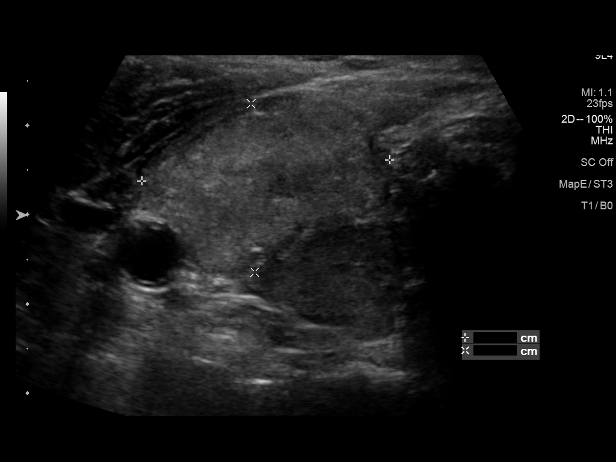
[im 12/56]
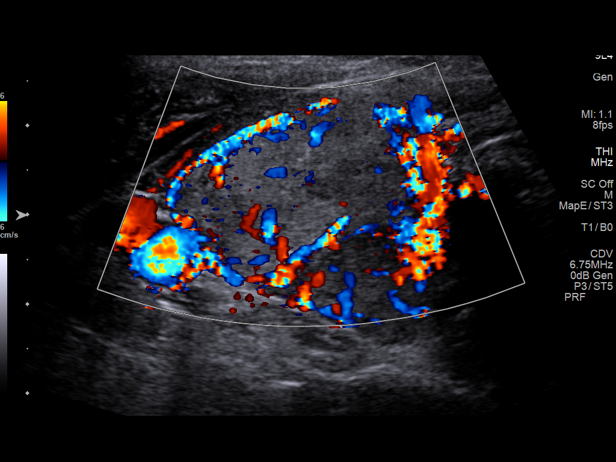
[im 17/56]
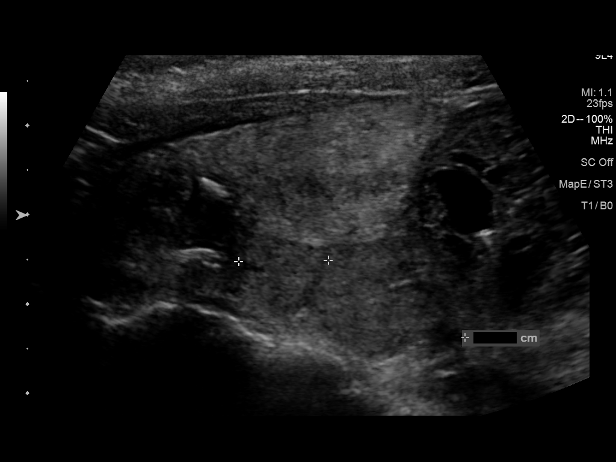
[im 21/56]
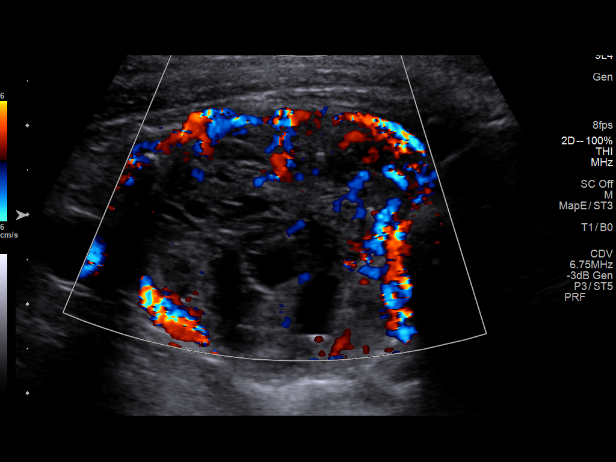
[im 23/56]
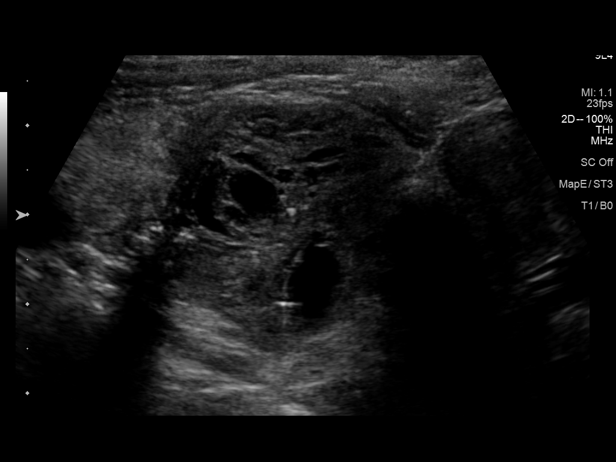
[im 28/56]
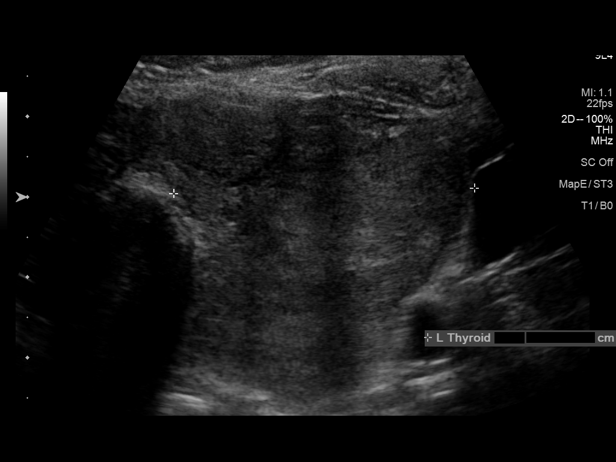
[im 33/56]
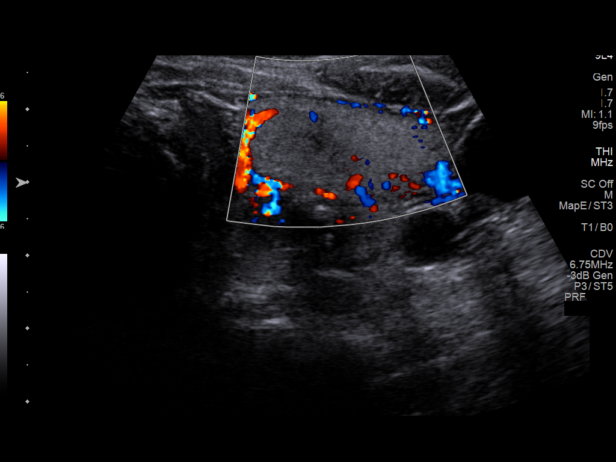
[im 35/56]
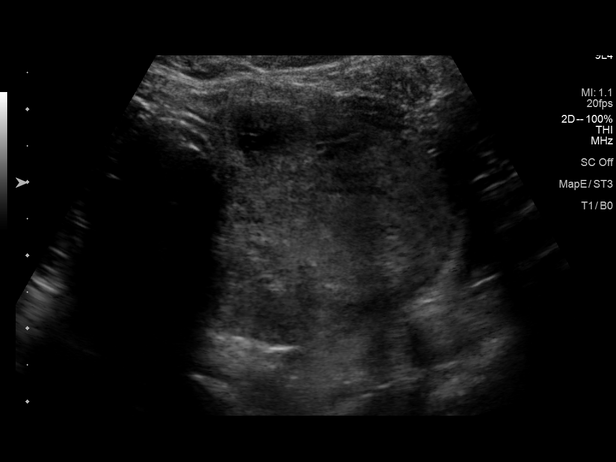
[im 39/56]
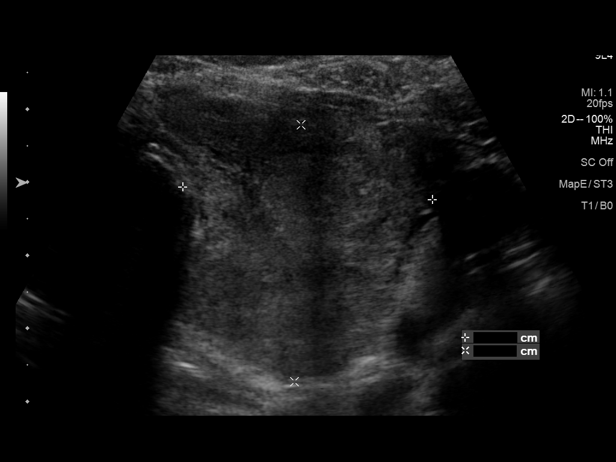
[im 44/56]
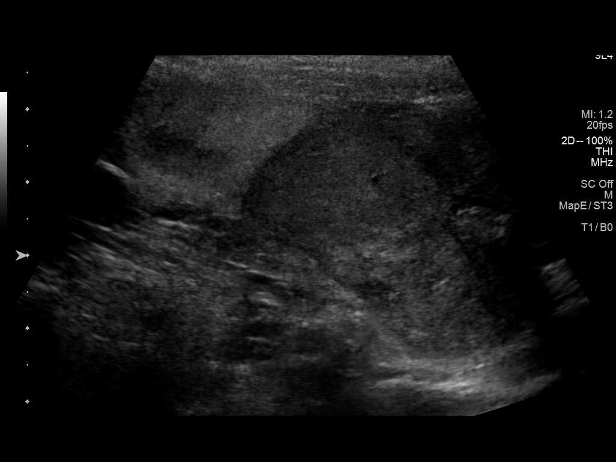
[im 46/56]
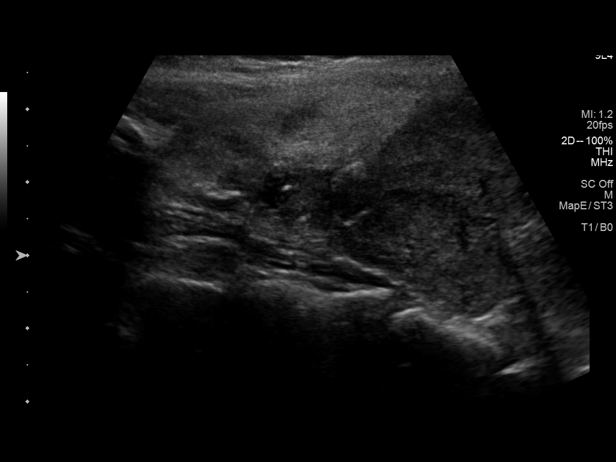
[im 51/56]
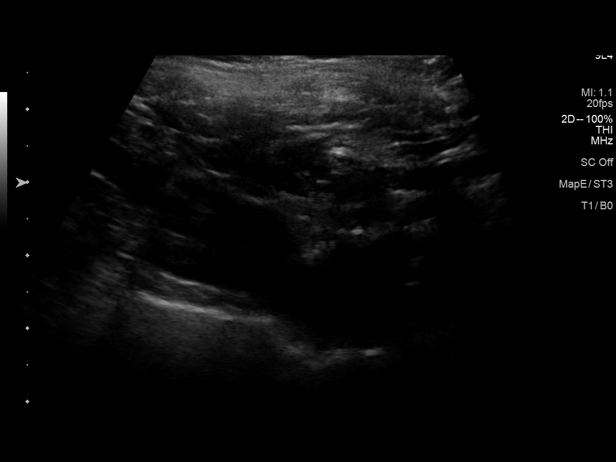
[im 56/56]
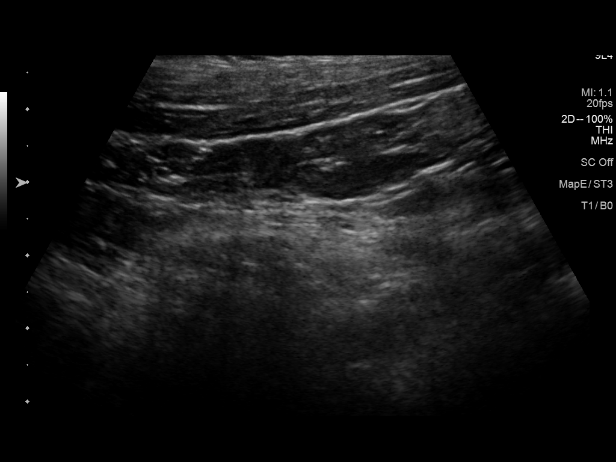

[15 of 25 positions shown; findings below may reference images not displayed]

FINDINGS: Parenchymal Echotexture: Markedly heterogenous

Isthmus: 0.7 cm

Right lobe: 6.8 x 3.3 x 2.7 cm

Left lobe: 8.0 x 4.0 x 3.7 cm

_________________________________________________________

Estimated total number of nodules >/= 1 cm: 6-10

Number of spongiform nodules >/=  2 cm not described below (TR1): 0

Number of mixed cystic and solid nodules >/= 1.5 cm not described
below (TR2): 0

_________________________________________________________

Nodule # 1:

Location: Right; Superior

Maximum size: 1.4 cm; Other 2 dimensions: 1.3 x 1.0 cm

Composition: mixed cystic and solid (1)

Echogenicity: hypoechoic (2)

Shape: not taller-than-wide (0)

Margins: ill-defined (0)

Echogenic foci: macrocalcifications (1)

ACR TI-RADS total points: 4.

ACR TI-RADS risk category: TR4 (4-6 points).

ACR TI-RADS recommendations:

*Given size (>/= 1 - 1.4 cm) and appearance, a follow-up ultrasound
in 1 year should be considered based on TI-RADS criteria.

_________________________________________________________

Nodule # 2:

Location: Right; Mid

Maximum size: 2.8 cm; Other 2 dimensions: 2.6 x 1.9 cm

Composition: solid/almost completely solid (2)

Echogenicity: isoechoic (1)

Shape: not taller-than-wide (0)

Margins: smooth (0)

Echogenic foci: none (0)

ACR TI-RADS total points: 3.

ACR TI-RADS risk category: TR3 (3 points).

ACR TI-RADS recommendations:

**Given size (>/= 2.5 cm) and appearance, fine needle aspiration of
this mildly suspicious nodule should be considered based on TI-RADS
criteria.

_________________________________________________________

Nodule # 3:

Location: Right; Mid

Maximum size: 1.8 cm; Other 2 dimensions: 1.5 x 1.5 cm

Composition: solid/almost completely solid (2)

Echogenicity: hypoechoic (2)

Shape: not taller-than-wide (0)

Margins: ill-defined (0)

Echogenic foci: none (0)

ACR TI-RADS total points: 4.

ACR TI-RADS risk category: TR4 (4-6 points).

ACR TI-RADS recommendations:

**Given size (>/= 1.5 cm) and appearance, fine needle aspiration of
this moderately suspicious nodule should be considered based on
TI-RADS criteria.

_________________________________________________________

Nodule # 4:

Location: Right; Inferior

Maximum size: 1.0 cm; Other 2 dimensions: 1.0 x 1.0 cm

Composition: solid/almost completely solid (2)

Echogenicity: hypoechoic (2)

Shape: not taller-than-wide (0)

Margins: ill-defined (0)

Echogenic foci: none (0)

ACR TI-RADS total points: 4.

ACR TI-RADS risk category: TR4 (4-6 points).

ACR TI-RADS recommendations:

*Given size (>/= 1 - 1.4 cm) and appearance, a follow-up ultrasound
in 1 year should be considered based on TI-RADS criteria.

_________________________________________________________

Nodule # 5:

Location: Right; Inferior

Maximum size: 3.5 cm; Other 2 dimensions: 3.2 x 3.0 cm

Composition: solid/almost completely solid (2)

Echogenicity: hypoechoic (2)

Shape: not taller-than-wide (0)

Margins: smooth (0)

Echogenic foci: none (0)

ACR TI-RADS total points: 4.

ACR TI-RADS risk category: TR4 (4-6 points).

ACR TI-RADS recommendations:

**Given size (>/= 1.5 cm) and appearance, fine needle aspiration of
this moderately suspicious nodule should be considered based on
TI-RADS criteria.

_________________________________________________________

Nodule # 6: Strongly favored to represent a pseudo nodules/island of
relatively normal parenchyma that appears nodular given that its
borders are created by multiple nodules peripherally.

_________________________________________________________

Nodule # 7:

Location: Left; Mid

Maximum size: 1.2 cm; Other 2 dimensions: 1.2 x 1.2 cm

Composition: cannot determine (2)

Echogenicity: cannot determine (1)

Shape: not taller-than-wide (0)

Margins: smooth (0)

Echogenic foci: peripheral calcifications (2)

ACR TI-RADS total points: 5.

ACR TI-RADS risk category: TR4 (4-6 points).

ACR TI-RADS recommendations:

*Given size (>/= 1 - 1.4 cm) and appearance, a follow-up ultrasound
in 1 year should be considered based on TI-RADS criteria.

_________________________________________________________

Nodule # 8:

Location: Left; Inferior

Maximum size: 4.7 cm; Other 2 dimensions: 3.5 x 3.4 cm

Composition: solid/almost completely solid (2)

Echogenicity: hypoechoic (2)

Shape: not taller-than-wide (0)

Margins: smooth (0)

Echogenic foci: macrocalcifications (1)

ACR TI-RADS total points: 5.

ACR TI-RADS risk category: TR4 (4-6 points).

ACR TI-RADS recommendations:

**Given size (>/= 1.5 cm) and appearance, fine needle aspiration of
this moderately suspicious nodule should be considered based on
TI-RADS criteria.

________________________________________________________
IMPRESSION: 1. Enlarged, heterogeneous multinodular thyroid gland most
consistent with multinodular goiter.
2. In the setting of multiple nodules meeting criteria to consider
biopsy, current consensus criteria recommend biopsy of the 2 most
suspicious nodules. Consider fine-needle aspiration biopsy of the
3.5 cm TI-RADS category 4 nodule in the right inferior gland and the
4.7 cm TI-RADS category 4 nodule in the left inferior gland (#5 and
#8 respectively).
3. The remaining nodules can be followed with repeat thyroid
ultrasound in 1 year.

The above is in keeping with the ACR TI-RADS recommendations - [HOSPITAL] 4313;[DATE].

## 2023-01-06 ENCOUNTER — Ambulatory Visit
Admission: RE | Admit: 2023-01-06 | Discharge: 2023-01-06 | Disposition: A | Discharge status: Home / Self Care | Payer: Medicare Other | Source: Ambulatory Visit | Attending: Adult Health | Admitting: Adult Health

## 2023-01-06 ENCOUNTER — Ambulatory Visit (INDEPENDENT_AMBULATORY_CARE_PROVIDER_SITE_OTHER): Payer: Medicare Other | Admitting: Adult Health

## 2023-01-06 ENCOUNTER — Encounter: Payer: Self-pay | Admitting: Adult Health

## 2023-01-06 VITALS — BP 142/78 | HR 79 | Temp 97.3°F | Resp 18 | Ht 66.0 in | Wt 200.8 lb

## 2023-01-06 DIAGNOSIS — M79645 Pain in left finger(s): Secondary | ICD-10-CM | POA: Diagnosis not present

## 2023-01-06 DIAGNOSIS — F259 Schizoaffective disorder, unspecified: Secondary | ICD-10-CM

## 2023-01-06 DIAGNOSIS — I10 Essential (primary) hypertension: Secondary | ICD-10-CM

## 2023-01-06 DIAGNOSIS — R000 Tachycardia, unspecified: Secondary | ICD-10-CM

## 2023-01-06 MED ORDER — CLONIDINE HCL 0.1 MG PO TABS: 0.1000 mg | ORAL_TABLET | ORAL | Status: AC

## 2023-01-06 MED ORDER — AMLODIPINE BESYLATE 10 MG PO TABS: 10.0000 mg | ORAL_TABLET | Freq: Every day | ORAL | 1 refills | Status: DC

## 2023-01-06 MED ORDER — RISPERIDONE 3 MG PO TABS: ORAL_TABLET | ORAL | 0 refills | Status: DC

## 2023-01-06 MED ORDER — RISPERIDONE 2 MG PO TABS: 2.0000 mg | ORAL_TABLET | Freq: Every morning | ORAL | 1 refills | Status: DC

## 2023-01-06 MED ORDER — METOPROLOL TARTRATE 25 MG PO TABS: ORAL_TABLET | ORAL | 1 refills | Status: AC

## 2023-01-06 NOTE — Progress Notes (Signed)
[facility] clinic  Provider: Kenard Gower DNP  Code Status:  Full Code  Goals of Care:     01/06/2023    3:30 PM  Advanced Directives  Does Patient Have a Medical Advance Directive? No  Would patient like information on creating a medical advance directive? No - Patient declined     Chief Complaint  Patient presents with   Acute Visit    elevated BP, not taking medications     HPI: Patient is a 67 y.o. female seen today for an acute visit for elevated BP, not taking medications. She was accompanied today by her daughter whom she lives with. BP 147/89. She has not taken medications in a long time. She has schizoaffective disorder and was prescribed Risperdal. She denies agitation. Daughter requested for all her medications to be refilled at the pharmacy She stated that she adds Risperdal to her food. She is complaining of pain on her left pinky. She stated that last May 20,2024, she was was pulled by a pocket bulldog while she was holding the leash and ran. Pain on her left pinky, 7/10.    Past Medical History:  Diagnosis Date   Hypertension    Paranoid schizophrenia (HCC)     Past Surgical History:  Procedure Laterality Date   CHOLECYSTECTOMY      Allergies  Allergen Reactions   Vicodin [Hydrocodone-Acetaminophen] Rash    Difficulty breathing    Influenza Vaccines     Patient states last flu vaccine caused SOB    Morphine And Codeine     difficulty breathing     Outpatient Encounter Medications as of 01/06/2023  Medication Sig   amLODipine (NORVASC) 10 MG tablet Take 1 tablet (10 mg total) by mouth daily.   diclofenac (VOLTAREN) 75 MG EC tablet Take 1 tablet (75 mg total) by mouth 2 (two) times daily as needed for moderate pain.   metoprolol tartrate (LOPRESSOR) 25 MG tablet TAKE 1 TABLET(25 MG) BY MOUTH TWICE DAILY   Multiple Vitamin (MULTIVITAMIN) tablet Take 1 tablet by mouth daily.   risperiDONE (RISPERDAL) 2 MG tablet TAKE 1 TABLET BY MOUTH EVERY MORNING    risperiDONE (RISPERDAL) 3 MG tablet TAKE 1 TABLET(3 MG) BY MOUTH AT BEDTIME   Tdap (BOOSTRIX) 5-2.5-18.5 LF-MCG/0.5 injection Inject 0.5 mLs into the muscle once.   potassium chloride SA (KLOR-CON) 20 MEQ tablet Take 2 tablets (40 mEq total) by mouth once for 1 dose.   No facility-administered encounter medications on file as of 01/06/2023.    Review of Systems:  Review of Systems  Constitutional:  Negative for appetite change, chills, fatigue and fever.  HENT:  Negative for congestion, hearing loss, rhinorrhea and sore throat.   Eyes: Negative.   Respiratory:  Negative for cough, shortness of breath and wheezing.   Cardiovascular:  Negative for chest pain, palpitations and leg swelling Gastrointestinal:  Negative for abdominal pain, constipation, diarrhea, nausea and vomiting.  Genitourinary:  Negative for dysuria.  Musculoskeletal:  Negative for arthralgias, back pain and myalgias.  Skin:  Negative for color change, rash and wound.  Neurological:  Negative for dizziness, weakness and headaches.  Psychiatric/Behavioral:  Negative for behavioral problems. The patient is not nervous/anxious.    Health Maintenance  Topic Date Due   DTaP/Tdap/Td (1 - Tdap) Never done   Zoster Vaccines- Shingrix (1 of 2) Never done   Medicare Annual Wellness (AWV)  01/01/2019   COVID-19 Vaccine (3 - Pfizer risk series) 02/01/2020   Pneumonia Vaccine 29+ Years old (1  of 1 - PCV) Never done   DEXA SCAN  Never done   MAMMOGRAM  07/23/2023 (Originally 03/13/2005)   Colonoscopy  07/23/2023 (Originally 03/13/2000)   INFLUENZA VACCINE  07/22/2078 (Originally 02/20/2023)   Hepatitis C Screening  Completed   HPV VACCINES  Aged Out    Physical Exam: Vitals:   01/06/23 1511  BP: (!) 147/89  Pulse: 79  Resp: 18  Temp: (!) 97.3 F (36.3 C)  SpO2: 99%  Weight: 200 lb 12.8 oz (91.1 kg)  Height: 5\' 6"  (1.676 m)   Body mass index is 32.41 kg/m. Physical Exam Constitutional:      Appearance: Normal  appearance. She is obese.  HENT:     Head: Normocephalic and atraumatic.     Nose: Nose normal.     Mouth/Throat:     Mouth: Mucous membranes are moist.  Eyes:     Conjunctiva/sclera: Conjunctivae normal.  Cardiovascular:     Rate and Rhythm: Normal rate and regular rhythm.  Pulmonary:     Effort: Pulmonary effort is normal.     Breath sounds: Normal breath sounds.  Abdominal:     General: Bowel sounds are normal.     Palpations: Abdomen is soft.  Musculoskeletal:     Cervical back: Normal range of motion.     Comments: Left pinky tender  Skin:    General: Skin is warm and dry.  Neurological:     General: No focal deficit present.     Mental Status: She is alert and oriented to person, place, and time.  Psychiatric:        Mood and Affect: Mood normal.        Behavior: Behavior normal.        Thought Content: Thought content normal.        Judgment: Judgment normal.     Labs reviewed: Basic Metabolic Panel: No results for input(s): "NA", "K", "CL", "CO2", "GLUCOSE", "BUN", "CREATININE", "CALCIUM", "MG", "PHOS", "TSH" in the last 8760 hours. Liver Function Tests: No results for input(s): "AST", "ALT", "ALKPHOS", "BILITOT", "PROT", "ALBUMIN" in the last 8760 hours. No results for input(s): "LIPASE", "AMYLASE" in the last 8760 hours. No results for input(s): "AMMONIA" in the last 8760 hours. CBC: No results for input(s): "WBC", "NEUTROABS", "HGB", "HCT", "MCV", "PLT" in the last 8760 hours. Lipid Panel: No results for input(s): "CHOL", "HDL", "LDLCALC", "TRIG", "CHOLHDL", "LDLDIRECT" in the last 8760 hours. Lab Results  Component Value Date   HGBA1C 5.9 (H) 11/02/2020    Procedures since last visit: No results found.  Assessment/Plan  1. Essential hypertension -  BP 147/89 -  daughter requested Clonidine to be given since patient does not take medications at home - amLODipine (NORVASC) 10 MG tablet; Take 1 tablet (10 mg total) by mouth daily.  Dispense: 90 tablet;  Refill: 1 - metoprolol tartrate (LOPRESSOR) 25 MG tablet; TAKE 1 TABLET(25 MG) BY MOUTH TWICE DAILY  Dispense: 180 tablet; Refill: 1 - cloNIDine (CATAPRES) tablet 0.1 mg  2. Finger pain, left -  left pinky tender, no swelling - DG Hand Complete Left showed left 5th digit displaced fracture at the base -  referred to orthopedics  3. Schizoaffective disorder, unspecified type (HCC) - risperiDONE (RISPERDAL) 3 MG tablet; TAKE 1 TABLET(3 MG) BY MOUTH AT BEDTIME  Dispense: 30 tablet; Refill: 0 - risperiDONE (RISPERDAL) 2 MG tablet; Take 1 tablet (2 mg total) by mouth every morning.  Dispense: 90 tablet; Refill: 1   Labs/tests ordered:  x-ray of left hand  Next appt:  Visit date not found

## 2023-01-11 NOTE — Progress Notes (Signed)
X-ray showed displaced fracture at the base of fifth finger.  Apply splint to left pinky, may buddy splint with left 4th finger, was referred to orthopedics

## 2023-02-03 ENCOUNTER — Encounter: Payer: Self-pay | Admitting: Family

## 2023-02-03 ENCOUNTER — Encounter: Payer: Medicare Other | Admitting: Family

## 2023-02-03 NOTE — Progress Notes (Signed)
  This encounter was created in error - please disregard. No show 

## 2023-03-11 ENCOUNTER — Other Ambulatory Visit: Payer: Self-pay | Admitting: Adult Health

## 2023-03-11 DIAGNOSIS — F259 Schizoaffective disorder, unspecified: Secondary | ICD-10-CM

## 2023-03-11 NOTE — Telephone Encounter (Signed)
High Risk Warning Populated when attempting to refill, I will send to Provider for further review 

## 2023-07-31 ENCOUNTER — Other Ambulatory Visit: Payer: Self-pay | Admitting: Adult Health

## 2023-07-31 DIAGNOSIS — F259 Schizoaffective disorder, unspecified: Secondary | ICD-10-CM

## 2023-07-31 DIAGNOSIS — I10 Essential (primary) hypertension: Secondary | ICD-10-CM

## 2023-08-01 ENCOUNTER — Other Ambulatory Visit: Payer: Self-pay | Admitting: Family

## 2023-08-01 DIAGNOSIS — I10 Essential (primary) hypertension: Secondary | ICD-10-CM

## 2023-08-01 NOTE — Telephone Encounter (Signed)
 Pharmacy requested refill.  Pended Rx and sent to [facility] for approval due to HIGH ALERT Warning.

## 2023-09-10 ENCOUNTER — Other Ambulatory Visit: Payer: Self-pay | Admitting: Family

## 2023-09-10 DIAGNOSIS — I10 Essential (primary) hypertension: Secondary | ICD-10-CM

## 2023-09-10 NOTE — Telephone Encounter (Signed)
 Pharmacy requested refill.  Patient is OVERDUE for appointment and I have tried calling patient several times to schedule. Note added to Rx last refill for patient to call and schedule an appointment.  Also, MyChart message sent to patient.   Sent to [employer] [facility]
